# Patient Record
Sex: Female | Born: 1945 | Race: White | Hispanic: No | Marital: Married | Smoking: Never smoker
Health system: Southern US, Community
[De-identification: ages and names within clinical notes are randomized; demographics above are authoritative.]

---

## 1997-06-11 ENCOUNTER — Emergency Department (HOSPITAL_COMMUNITY): Admission: EM | Admit: 1997-06-11 | Discharge: 1997-06-11 | Payer: Self-pay | Admitting: Emergency Medicine

## 2005-03-02 ENCOUNTER — Emergency Department (HOSPITAL_COMMUNITY): Admission: EM | Admit: 2005-03-02 | Discharge: 2005-03-02 | Payer: Self-pay | Admitting: Emergency Medicine

## 2015-09-20 ENCOUNTER — Encounter (HOSPITAL_COMMUNITY): Payer: Self-pay | Admitting: *Deleted

## 2015-09-20 ENCOUNTER — Ambulatory Visit (HOSPITAL_COMMUNITY)
Admission: EM | Admit: 2015-09-20 | Discharge: 2015-09-20 | Disposition: A | Discharge status: Home / Self Care | Payer: 59 | Attending: Emergency Medicine | Admitting: Emergency Medicine

## 2015-09-20 DIAGNOSIS — T148 Other injury of unspecified body region: Secondary | ICD-10-CM | POA: Diagnosis not present

## 2015-09-20 DIAGNOSIS — W57XXXA Bitten or stung by nonvenomous insect and other nonvenomous arthropods, initial encounter: Secondary | ICD-10-CM | POA: Diagnosis not present

## 2015-09-20 NOTE — ED Notes (Signed)
Patient reports while at home yesterday she felt a pinch and reached back and felt that she had a small bump and it itched. Did not see a spider or any type of bug. Patient states this area has grown in size since yesterday and itches. Patient with small bug bite to middle of lower back. No signs of infection noted.

## 2015-09-20 NOTE — Discharge Instructions (Signed)
Insect Bite °Mosquitoes, flies, fleas, bedbugs, and other insects can bite. Insect bites are different from insect stings. The bite may be red, puffy (swollen), and itchy for 2 to 4 days. Most bites get better on their own. °HOME CARE  °· Do not scratch the bite. °· Keep the bite clean and dry. Wash the bite with soap and water every day, as told by your doctor. °· If directed, apply ice to the bite area. °¨ Put ice in a plastic bag. °¨ Place a towel between your skin and the bag. °¨ Leave the ice on for 20 minutes, 2-3 times per day. °· Follow instructions from your doctor about using medicated lotions or creams. These can help with itching. °· Apply or take over-the-counter and prescription medicines only as told by your doctor. °· If you were given an antibiotic medicine, use it as told by your doctor. Do not stop using the medicine even if your condition improves. °· Keep all follow-up visits as told by your doctor. This is important. °GET HELP IF: °· You have redness, swelling (inflammation), or pain near your bite that is getting worse. °· You have a fever. °GET HELP RIGHT AWAY IF:  °· You have joint pain.   °· You have fluid, blood, or pus coming from the bite area.   °· You have a headache. °· You have neck pain. °· You feel weaker than you normally do.   °· You have a rash.   °· You have chest pain. °· You have shortness of breath. °· You have stomach pain, feel sick to your stomach (nauseous), or throw up (vomit). °· You feel more tired or sleepy than you normally do. °  °This information is not intended to replace advice given to you by your health care provider. Make sure you discuss any questions you have with your health care provider. °  °Document Released: 02/18/2000 Document Revised: 11/11/2014 Document Reviewed: 07/08/2014 °Elsevier Interactive Patient Education ©2016 [employer] ° °

## 2015-09-20 NOTE — ED Provider Notes (Signed)
CSN: [id number]     Arrival date & time 09/20/15  1007 History   First MD Initiated Contact with Patient 09/20/15 1129     Chief Complaint  Patient presents with  . Insect Bite   (Consider location/radiation/quality/duration/timing/severity/associated sxs/prior Treatment) HPI History obtained from patient:  Pt presents with the cc of:  Bug bite Duration of symptoms: 4 days Treatment prior to arrival: Water alcohol and vinegar Context: Patient states that she thinks she was bitten by a possible brown recluse spider on the small of her back 4 days ago. She states that the red area was quite small discharged and now has enlarged a bit. She states that she did not see a spider but felt some type of bite or sting when she sat down on the couch. Other symptoms include: None Pain score: 1 FAMILY HISTORY: Cancer    History reviewed. No pertinent past medical history. History reviewed. No pertinent past surgical history. Family History  Problem Relation Age of Onset  . Cancer Mother   . Cancer Father    Social History  Substance Use Topics  . Smoking status: Never Smoker   . Smokeless tobacco: None  . Alcohol Use: No   OB History    No data available     Review of Systems  Denies: HEADACHE, NAUSEA, ABDOMINAL PAIN, CHEST PAIN, CONGESTION, DYSURIA, SHORTNESS OF BREATH  Allergies  Penicillins  Home Medications   Prior to Admission medications   Not on File   Meds Ordered and Administered this Visit  Medications - No data to display  BP 163/93 mmHg  Pulse 100  Temp(Src) 97.8 F (36.6 C) (Oral)  Resp 17  SpO2 100% No data found.   Physical Exam NURSES NOTES AND VITAL SIGNS REVIEWED. CONSTITUTIONAL: Well developed, well nourished, no acute distress HEENT: normocephalic, atraumatic EYES: Conjunctiva normal NECK:normal ROM, supple, no adenopathy PULMONARY:No respiratory distress, normal effort ABDOMINAL: Soft, ND, NT BS+, No CVAT MUSCULOSKELETAL: Normal ROM of all  extremities, Patient has a small red minimally swollen area at the small of her back nontender not hot to touch. No signs of cellulitis. SKIN: warm and dry without rash PSYCHIATRIC: Mood and affect, behavior are normal  ED Course  Procedures (including critical care time)  Labs Review Labs Reviewed - No data to display  Imaging Review No results found.   Visual Acuity Review  Right Eye Distance:   Left Eye Distance:   Bilateral Distance:    Right Eye Near:   Left Eye Near:    Bilateral Near:         MDM   1. Insect bite     Patient is reassured that there are no issues that require transfer to higher level of care at this time or additional tests. Patient is advised to continue home symptomatic treatment. Patient is advised that if there are new or worsening symptoms to attend the emergency department, contact primary care provider, or return to UC. Instructions of care provided discharged home in stable condition.    THIS NOTE WAS GENERATED USING A VOICE RECOGNITION SOFTWARE PROGRAM. ALL REASONABLE EFFORTS  WERE MADE TO PROOFREAD THIS DOCUMENT FOR ACCURACY.  I have verbally reviewed the discharge instructions with the patient. A printed AVS was given to the patient.  All questions were answered prior to discharge.      Tharon AquasFrank C Patrick, PA 09/20/15 1904

## 2016-07-07 ENCOUNTER — Encounter: Payer: Self-pay | Admitting: Family Medicine

## 2016-07-07 ENCOUNTER — Ambulatory Visit (INDEPENDENT_AMBULATORY_CARE_PROVIDER_SITE_OTHER): Payer: 59 | Admitting: Family Medicine

## 2016-07-07 VITALS — BP 140/80 | HR 96 | Temp 98.2°F | Resp 14 | Ht 63.5 in | Wt 122.0 lb

## 2016-07-07 DIAGNOSIS — R5382 Chronic fatigue, unspecified: Secondary | ICD-10-CM

## 2016-07-07 DIAGNOSIS — Z0189 Encounter for other specified special examinations: Secondary | ICD-10-CM | POA: Diagnosis not present

## 2016-07-07 DIAGNOSIS — Z0000 Encounter for general adult medical examination without abnormal findings: Secondary | ICD-10-CM

## 2016-07-07 DIAGNOSIS — Z23 Encounter for immunization: Secondary | ICD-10-CM

## 2016-07-07 LAB — CBC WITH DIFFERENTIAL/PLATELET
Basophils Absolute: 0 cells/uL (ref 0–200)
Basophils Relative: 0 %
EOS PCT: 1 %
Eosinophils Absolute: 46 cells/uL (ref 15–500)
HCT: 41.7 % (ref 35.0–45.0)
HEMOGLOBIN: 13.9 g/dL (ref 12.0–15.0)
LYMPHS ABS: 2070 {cells}/uL (ref 850–3900)
Lymphocytes Relative: 45 %
MCH: 28.4 pg (ref 27.0–33.0)
MCHC: 33.3 g/dL (ref 32.0–36.0)
MCV: 85.3 fL (ref 80.0–100.0)
MONOS PCT: 9 %
MPV: 9.9 fL (ref 7.5–12.5)
Monocytes Absolute: 414 cells/uL (ref 200–950)
NEUTROS ABS: 2070 {cells}/uL (ref 1500–7800)
NEUTROS PCT: 45 %
PLATELETS: 233 10*3/uL (ref 140–400)
RBC: 4.89 MIL/uL (ref 3.80–5.10)
RDW: 14.2 % (ref 11.0–15.0)
WBC: 4.6 10*3/uL (ref 3.8–10.8)

## 2016-07-07 NOTE — Progress Notes (Signed)
Subjective:    Patient ID: Christina Jeffersonatricia Chase, female    DOB: 10-09-1945, 71 y.o.   MRN: [id number]  HPI Here today to establish care. Patient is due for Pneumovax 23, Prevnar 13, the shingles vaccine, and the tetanus shot. She is also overdue for mammogram, colonoscopy, Pap smear, hepatitis C screening, and a bone density test. Her only concern today is to update her immunizations. No past medical history on file. No past surgical history on file. No current outpatient prescriptions on file prior to visit.   No current facility-administered medications on file prior to visit.     Allergies  Allergen Reactions  . Penicillins    Social History   Social History  . Marital status: Married    Spouse name: N/A  . Number of children: N/A  . Years of education: N/A   Occupational History  . Not on file.   Social History Main Topics  . Smoking status: Never Smoker  . Smokeless tobacco: Never Used  . Alcohol use No  . Drug use: No  . Sexual activity: Not on file   Other Topics Concern  . Not on file   Social History Narrative  . No narrative on file   Family History  Problem Relation Age of Onset  . Dementia Mother   . Heart disease Mother   . Heart disease Father   . Hypertension Father   . Cancer Maternal Grandmother     colon       Review of Systems  Constitutional: Positive for fatigue.  All other systems reviewed and are negative.      Objective:   Physical Exam  Constitutional: She is oriented to person, place, and time. She appears well-developed and well-nourished. No distress.  HENT:  Head: Normocephalic and atraumatic.  Right Ear: External ear normal.  Left Ear: External ear normal.  Nose: Nose normal.  Mouth/Throat: Oropharynx is clear and moist. No oropharyngeal exudate.  Eyes: Conjunctivae and EOM are normal. Pupils are equal, round, and reactive to light. Right eye exhibits no discharge. Left eye exhibits no discharge. No scleral icterus.    Neck: Normal range of motion. Neck supple. No JVD present. No tracheal deviation present. No thyromegaly present.  Cardiovascular: Normal rate, regular rhythm, normal heart sounds and intact distal pulses.  Exam reveals no gallop and no friction rub.   No murmur heard. Pulmonary/Chest: Effort normal and breath sounds normal. No stridor. No respiratory distress. She has no wheezes. She has no rales. She exhibits no tenderness.  Abdominal: Soft. Bowel sounds are normal. She exhibits no distension and no mass. There is no tenderness. There is no rebound and no guarding.  Musculoskeletal: Normal range of motion. She exhibits no edema, tenderness or deformity.  Lymphadenopathy:    She has no cervical adenopathy.  Neurological: She is alert and oriented to person, place, and time. She has normal reflexes. She displays normal reflexes. No cranial nerve deficit. She exhibits normal muscle tone. Coordination normal.  Skin: No rash noted. She is not diaphoretic. No erythema. No pallor.  Psychiatric: She has a normal mood and affect. Her behavior is normal. Judgment and thought content normal.  Vitals reviewed.         Assessment & Plan:  General medical exam - Plan: Lipid panel  Chronic fatigue - Plan: CBC with Differential/Platelet, COMPLETE METABOLIC PANEL WITH GFR, TSH, Vitamin B12 Patient is on no medication and denies any medical history. She denies any previous surgeries. Overall she's been extremely healthy.  She agrees to receive Pneumovax 23 area she will consider Prevnar 13 next year we discussed the shingles shot but she declined that today. She declined the tetanus shot. She states that she will schedule to see a gynecologist for her pelvic exam, mammogram, and bone density. She refuses to allow me to schedule a colonoscopy for her. She does consent to basic lab work including a CBC, CMP, fasting lipid panel. She also reports some fatigue and therefore she would like me to check her thyroid  as well as her B12.

## 2016-07-07 NOTE — Addendum Note (Signed)
Addended by: Legrand RamsWILLIS, Erie Sica B on: 07/07/2016 11:29 AM   Modules accepted: Orders

## 2016-07-08 LAB — COMPLETE METABOLIC PANEL WITH GFR
ALBUMIN: 4.8 g/dL (ref 3.6–5.1)
ALK PHOS: 63 U/L (ref 33–130)
ALT: 15 U/L (ref 6–29)
AST: 20 U/L (ref 10–35)
BUN: 11 mg/dL (ref 7–25)
CHLORIDE: 102 mmol/L (ref 98–110)
CO2: 26 mmol/L (ref 20–31)
Calcium: 10.1 mg/dL (ref 8.6–10.4)
Creat: 0.61 mg/dL (ref 0.60–0.93)
GFR, Est African American: >89 mL/min (ref >=60)
GLUCOSE: 113 mg/dL — AB (ref 70–99)
POTASSIUM: 3.8 mmol/L (ref 3.5–5.3)
SODIUM: 140 mmol/L (ref 135–146)
Total Bilirubin: 0.8 mg/dL (ref 0.2–1.2)
Total Protein: 7 g/dL (ref 6.1–8.1)

## 2016-07-08 LAB — VITAMIN B12: Vitamin B-12: 697 pg/mL (ref 200–1100)

## 2016-07-08 LAB — LIPID PANEL
CHOL/HDL RATIO: 2.8 ratio (ref <5.0)
Cholesterol: 218 mg/dL — ABNORMAL HIGH (ref <200)
HDL: 78 mg/dL (ref >50)
LDL CALC: 127 mg/dL — AB (ref <100)
Triglycerides: 67 mg/dL (ref <150)
VLDL: 13 mg/dL (ref <30)

## 2016-07-08 LAB — TSH: TSH: 1.41 m[IU]/L

## 2016-07-10 ENCOUNTER — Encounter: Payer: Self-pay | Admitting: Family Medicine

## 2017-08-30 DIAGNOSIS — K046 Periapical abscess with sinus: Secondary | ICD-10-CM | POA: Diagnosis not present

## 2017-10-05 ENCOUNTER — Encounter (HOSPITAL_COMMUNITY)
Admission: EM | Disposition: A | Discharge status: Home / Self Care | Payer: Self-pay | Source: Home / Self Care | Attending: Family Medicine

## 2017-10-05 ENCOUNTER — Other Ambulatory Visit: Payer: Self-pay

## 2017-10-05 ENCOUNTER — Inpatient Hospital Stay (HOSPITAL_COMMUNITY): Payer: Medicare Other

## 2017-10-05 ENCOUNTER — Emergency Department (HOSPITAL_COMMUNITY): Payer: Medicare Other

## 2017-10-05 ENCOUNTER — Inpatient Hospital Stay (HOSPITAL_COMMUNITY): Payer: Medicare Other | Admitting: Anesthesiology

## 2017-10-05 ENCOUNTER — Encounter (HOSPITAL_COMMUNITY): Payer: Self-pay | Admitting: *Deleted

## 2017-10-05 ENCOUNTER — Inpatient Hospital Stay (HOSPITAL_COMMUNITY)
Admission: EM | Admit: 2017-10-05 | Discharge: 2017-10-09 | DRG: 470 | Disposition: A | Discharge status: Home / Self Care | Payer: Medicare Other | Attending: Family Medicine | Admitting: Family Medicine

## 2017-10-05 DIAGNOSIS — Z880 Allergy status to penicillin: Secondary | ICD-10-CM | POA: Diagnosis not present

## 2017-10-05 DIAGNOSIS — E861 Hypovolemia: Secondary | ICD-10-CM | POA: Diagnosis present

## 2017-10-05 DIAGNOSIS — R7302 Impaired glucose tolerance (oral): Secondary | ICD-10-CM | POA: Diagnosis present

## 2017-10-05 DIAGNOSIS — Z8249 Family history of ischemic heart disease and other diseases of the circulatory system: Secondary | ICD-10-CM

## 2017-10-05 DIAGNOSIS — Z471 Aftercare following joint replacement surgery: Secondary | ICD-10-CM | POA: Diagnosis not present

## 2017-10-05 DIAGNOSIS — F419 Anxiety disorder, unspecified: Secondary | ICD-10-CM | POA: Diagnosis present

## 2017-10-05 DIAGNOSIS — Z96642 Presence of left artificial hip joint: Secondary | ICD-10-CM | POA: Diagnosis not present

## 2017-10-05 DIAGNOSIS — E785 Hyperlipidemia, unspecified: Secondary | ICD-10-CM | POA: Diagnosis present

## 2017-10-05 DIAGNOSIS — W1809XA Striking against other object with subsequent fall, initial encounter: Secondary | ICD-10-CM | POA: Diagnosis present

## 2017-10-05 DIAGNOSIS — Z01818 Encounter for other preprocedural examination: Secondary | ICD-10-CM | POA: Diagnosis not present

## 2017-10-05 DIAGNOSIS — Y92009 Unspecified place in unspecified non-institutional (private) residence as the place of occurrence of the external cause: Secondary | ICD-10-CM | POA: Diagnosis not present

## 2017-10-05 DIAGNOSIS — S72002A Fracture of unspecified part of neck of left femur, initial encounter for closed fracture: Secondary | ICD-10-CM | POA: Diagnosis not present

## 2017-10-05 DIAGNOSIS — Z800 Family history of malignant neoplasm of digestive organs: Secondary | ICD-10-CM

## 2017-10-05 DIAGNOSIS — Z79899 Other long term (current) drug therapy: Secondary | ICD-10-CM

## 2017-10-05 DIAGNOSIS — Z96641 Presence of right artificial hip joint: Secondary | ICD-10-CM | POA: Diagnosis not present

## 2017-10-05 DIAGNOSIS — D696 Thrombocytopenia, unspecified: Secondary | ICD-10-CM | POA: Diagnosis present

## 2017-10-05 DIAGNOSIS — Z419 Encounter for procedure for purposes other than remedying health state, unspecified: Secondary | ICD-10-CM

## 2017-10-05 DIAGNOSIS — E871 Hypo-osmolality and hyponatremia: Secondary | ICD-10-CM | POA: Diagnosis present

## 2017-10-05 DIAGNOSIS — S72012A Unspecified intracapsular fracture of left femur, initial encounter for closed fracture: Principal | ICD-10-CM | POA: Diagnosis present

## 2017-10-05 DIAGNOSIS — S72092A Other fracture of head and neck of left femur, initial encounter for closed fracture: Secondary | ICD-10-CM | POA: Diagnosis not present

## 2017-10-05 DIAGNOSIS — Z881 Allergy status to other antibiotic agents status: Secondary | ICD-10-CM

## 2017-10-05 DIAGNOSIS — R17 Unspecified jaundice: Secondary | ICD-10-CM | POA: Diagnosis present

## 2017-10-05 DIAGNOSIS — D62 Acute posthemorrhagic anemia: Secondary | ICD-10-CM | POA: Diagnosis not present

## 2017-10-05 DIAGNOSIS — M25752 Osteophyte, left hip: Secondary | ICD-10-CM | POA: Diagnosis present

## 2017-10-05 HISTORY — PX: TOTAL HIP ARTHROPLASTY: SHX124

## 2017-10-05 LAB — CBC WITH DIFFERENTIAL/PLATELET
Abs Immature Granulocytes: 0.1 10*3/uL (ref 0.0–0.1)
BASOS ABS: 0 10*3/uL (ref 0.0–0.1)
Basophils Relative: 0 %
Eosinophils Absolute: 0 10*3/uL (ref 0.0–0.7)
Eosinophils Relative: 0 %
HCT: 43.3 % (ref 36.0–46.0)
HEMOGLOBIN: 14.4 g/dL (ref 12.0–15.0)
IMMATURE GRANULOCYTES: 1 %
LYMPHS PCT: 9 %
Lymphs Abs: 0.9 10*3/uL (ref 0.7–4.0)
MCH: 29 pg (ref 26.0–34.0)
MCHC: 33.3 g/dL (ref 30.0–36.0)
MCV: 87.1 fL (ref 78.0–100.0)
Monocytes Absolute: 0.5 10*3/uL (ref 0.1–1.0)
Monocytes Relative: 5 %
NEUTROS ABS: 8.5 10*3/uL — AB (ref 1.7–7.7)
NEUTROS PCT: 85 %
Platelets: 182 10*3/uL (ref 150–400)
RBC: 4.97 MIL/uL (ref 3.87–5.11)
RDW: 13.7 % (ref 11.5–15.5)
WBC: 10 10*3/uL (ref 4.0–10.5)

## 2017-10-05 LAB — PROTIME-INR
INR: 0.98
PROTHROMBIN TIME: 12.9 s (ref 11.4–15.2)

## 2017-10-05 LAB — BASIC METABOLIC PANEL
ANION GAP: 12 (ref 5–15)
BUN: 6 mg/dL — ABNORMAL LOW (ref 8–23)
CHLORIDE: 97 mmol/L — AB (ref 98–111)
CO2: 24 mmol/L (ref 22–32)
Calcium: 10.8 mg/dL — ABNORMAL HIGH (ref 8.9–10.3)
Creatinine, Ser: 0.58 mg/dL (ref 0.44–1.00)
GFR calc non Af Amer: >60 mL/min (ref >60)
Glucose, Bld: 124 mg/dL — ABNORMAL HIGH (ref 70–99)
POTASSIUM: 3.5 mmol/L (ref 3.5–5.1)
SODIUM: 133 mmol/L — AB (ref 135–145)

## 2017-10-05 LAB — APTT: APTT: 28 s (ref 24–36)

## 2017-10-05 LAB — ABO/RH: ABO/RH(D): O POS

## 2017-10-05 SURGERY — ARTHROPLASTY, HIP, TOTAL, ANTERIOR APPROACH
Anesthesia: General | Site: Hip | Laterality: Left

## 2017-10-05 MED ORDER — ONDANSETRON HCL 4 MG/2ML IJ SOLN
INTRAMUSCULAR | Status: AC
  Filled 2017-10-05: qty 4

## 2017-10-05 MED ORDER — SODIUM CHLORIDE 0.9 % IV SOLN
INTRAVENOUS | Status: DC | PRN
  Administered 2017-10-05: 50 ug/min via INTRAVENOUS

## 2017-10-05 MED ORDER — PHENYLEPHRINE HCL 10 MG/ML IJ SOLN
INTRAMUSCULAR | Status: DC | PRN
  Administered 2017-10-05 (×3): 80 ug via INTRAVENOUS
  Administered 2017-10-05: 120 ug via INTRAVENOUS
  Administered 2017-10-05 (×3): 80 ug via INTRAVENOUS

## 2017-10-05 MED ORDER — MORPHINE SULFATE (PF) 4 MG/ML IV SOLN
0.5000 mg | INTRAVENOUS | Status: DC | PRN
  Administered 2017-10-06: 0.52 mg via INTRAVENOUS
  Filled 2017-10-05: qty 1

## 2017-10-05 MED ORDER — POVIDONE-IODINE 10 % EX SWAB: 2.0000 "application " | Freq: Once | CUTANEOUS | Status: DC

## 2017-10-05 MED ORDER — HYDROCODONE-ACETAMINOPHEN 5-325 MG PO TABS: 1.0000 | ORAL_TABLET | Freq: Four times a day (QID) | ORAL | Status: DC | PRN

## 2017-10-05 MED ORDER — PROPOFOL 10 MG/ML IV BOLUS
INTRAVENOUS | Status: DC | PRN
  Administered 2017-10-05 (×2): 30 mg via INTRAVENOUS
  Administered 2017-10-05: 40 mg via INTRAVENOUS

## 2017-10-05 MED ORDER — MIDAZOLAM HCL 2 MG/2ML IJ SOLN
INTRAMUSCULAR | Status: DC | PRN
  Administered 2017-10-05: 2 mg via INTRAVENOUS

## 2017-10-05 MED ORDER — PROPOFOL 500 MG/50ML IV EMUL
INTRAVENOUS | Status: DC | PRN
  Administered 2017-10-05: 50 ug/kg/min via INTRAVENOUS

## 2017-10-05 MED ORDER — BUPIVACAINE-EPINEPHRINE (PF) 0.25% -1:200000 IJ SOLN
INTRAMUSCULAR | Status: AC
  Filled 2017-10-05: qty 30

## 2017-10-05 MED ORDER — ACETAMINOPHEN 10 MG/ML IV SOLN
INTRAVENOUS | Status: AC
  Filled 2017-10-05: qty 100

## 2017-10-05 MED ORDER — NALOXONE HCL 0.4 MG/ML IJ SOLN
INTRAMUSCULAR | Status: AC
  Filled 2017-10-05: qty 1

## 2017-10-05 MED ORDER — LIDOCAINE 2% (20 MG/ML) 5 ML SYRINGE
INTRAMUSCULAR | Status: AC
  Filled 2017-10-05: qty 20

## 2017-10-05 MED ORDER — CEFAZOLIN SODIUM-DEXTROSE 2-4 GM/100ML-% IV SOLN: 2.0000 g | INTRAVENOUS | Status: DC

## 2017-10-05 MED ORDER — HYDROMORPHONE HCL 1 MG/ML IJ SOLN
INTRAMUSCULAR | Status: AC
  Filled 2017-10-05: qty 1

## 2017-10-05 MED ORDER — MIDAZOLAM HCL 2 MG/2ML IJ SOLN
INTRAMUSCULAR | Status: AC
  Filled 2017-10-05: qty 2

## 2017-10-05 MED ORDER — FENTANYL CITRATE (PF) 100 MCG/2ML IJ SOLN
50.0000 ug | INTRAMUSCULAR | Status: DC | PRN
  Filled 2017-10-05: qty 2

## 2017-10-05 MED ORDER — OXYCODONE HCL 5 MG/5ML PO SOLN: 5.0000 mg | Freq: Once | ORAL | Status: DC | PRN

## 2017-10-05 MED ORDER — PROBIOTIC ACIDOPHILUS PO CAPS: 1.0000 | ORAL_CAPSULE | Freq: Every day | ORAL | Status: DC

## 2017-10-05 MED ORDER — DEXAMETHASONE SODIUM PHOSPHATE 10 MG/ML IJ SOLN
INTRAMUSCULAR | Status: AC
  Filled 2017-10-05: qty 1

## 2017-10-05 MED ORDER — 0.9 % SODIUM CHLORIDE (POUR BTL) OPTIME
TOPICAL | Status: DC | PRN
  Administered 2017-10-05: 1000 mL

## 2017-10-05 MED ORDER — KETOROLAC TROMETHAMINE 30 MG/ML IJ SOLN
INTRAMUSCULAR | Status: AC
  Filled 2017-10-05: qty 1

## 2017-10-05 MED ORDER — SUGAMMADEX SODIUM 500 MG/5ML IV SOLN
INTRAVENOUS | Status: AC
  Filled 2017-10-05: qty 5

## 2017-10-05 MED ORDER — ONDANSETRON HCL 4 MG/2ML IJ SOLN
INTRAMUSCULAR | Status: DC | PRN
  Administered 2017-10-05: 4 mg via INTRAVENOUS

## 2017-10-05 MED ORDER — TRANEXAMIC ACID 1000 MG/10ML IV SOLN
1000.0000 mg | INTRAVENOUS | Status: AC
  Administered 2017-10-05: 1000 mg via INTRAVENOUS
  Filled 2017-10-05: qty 10

## 2017-10-05 MED ORDER — BUPIVACAINE-EPINEPHRINE (PF) 0.25% -1:200000 IJ SOLN
INTRAMUSCULAR | Status: DC | PRN
  Administered 2017-10-05: 30 mL

## 2017-10-05 MED ORDER — SODIUM CHLORIDE 0.9 % IV BOLUS
500.0000 mL | Freq: Once | INTRAVENOUS | Status: AC
  Administered 2017-10-05: 500 mL via INTRAVENOUS

## 2017-10-05 MED ORDER — VANCOMYCIN HCL IN DEXTROSE 1-5 GM/200ML-% IV SOLN
1000.0000 mg | INTRAVENOUS | Status: AC
  Administered 2017-10-05: 1000 mg via INTRAVENOUS
  Filled 2017-10-05: qty 200

## 2017-10-05 MED ORDER — HYDROMORPHONE HCL 1 MG/ML IJ SOLN
0.2500 mg | INTRAMUSCULAR | Status: DC | PRN
  Administered 2017-10-05: 0.25 mg via INTRAVENOUS

## 2017-10-05 MED ORDER — HEPARIN SODIUM (PORCINE) 5000 UNIT/ML IJ SOLN
5000.0000 [IU] | Freq: Three times a day (TID) | INTRAMUSCULAR | Status: DC
Start: 2017-10-05 — End: 2017-10-06
  Administered 2017-10-05 – 2017-10-06 (×2): 5000 [IU] via SUBCUTANEOUS
  Filled 2017-10-05 (×2): qty 1

## 2017-10-05 MED ORDER — CHLORHEXIDINE GLUCONATE 4 % EX LIQD: 60.0000 mL | Freq: Once | CUTANEOUS | Status: DC

## 2017-10-05 MED ORDER — EPHEDRINE 5 MG/ML INJ
INTRAVENOUS | Status: AC
  Filled 2017-10-05: qty 10

## 2017-10-05 MED ORDER — ONDANSETRON HCL 4 MG/2ML IJ SOLN
INTRAMUSCULAR | Status: AC
  Filled 2017-10-05: qty 2

## 2017-10-05 MED ORDER — BUPIVACAINE IN DEXTROSE 0.75-8.25 % IT SOLN
INTRATHECAL | Status: DC | PRN
  Administered 2017-10-05: 1.6 mL via INTRATHECAL

## 2017-10-05 MED ORDER — ROCURONIUM BROMIDE 10 MG/ML (PF) SYRINGE
PREFILLED_SYRINGE | INTRAVENOUS | Status: AC
  Filled 2017-10-05: qty 10

## 2017-10-05 MED ORDER — PROPOFOL 10 MG/ML IV BOLUS
INTRAVENOUS | Status: AC
  Filled 2017-10-05: qty 20

## 2017-10-05 MED ORDER — PHENYLEPHRINE 40 MCG/ML (10ML) SYRINGE FOR IV PUSH (FOR BLOOD PRESSURE SUPPORT)
PREFILLED_SYRINGE | INTRAVENOUS | Status: AC
  Filled 2017-10-05: qty 10

## 2017-10-05 MED ORDER — ACETAMINOPHEN 10 MG/ML IV SOLN
1000.0000 mg | INTRAVENOUS | Status: AC
  Administered 2017-10-05: 1000 mg via INTRAVENOUS

## 2017-10-05 MED ORDER — SODIUM CHLORIDE 0.9 % IR SOLN
Status: DC | PRN
  Administered 2017-10-05: 3000 mL

## 2017-10-05 MED ORDER — PROMETHAZINE HCL 25 MG/ML IJ SOLN: 6.2500 mg | INTRAMUSCULAR | Status: DC | PRN

## 2017-10-05 MED ORDER — LACTATED RINGERS IV SOLN
INTRAVENOUS | Status: DC
  Administered 2017-10-05 – 2017-10-09 (×3): via INTRAVENOUS

## 2017-10-05 MED ORDER — KETOROLAC TROMETHAMINE 30 MG/ML IJ SOLN
INTRAMUSCULAR | Status: DC | PRN
  Administered 2017-10-05: 30 mg via INTRAMUSCULAR

## 2017-10-05 MED ORDER — OXYCODONE HCL 5 MG PO TABS: 5.0000 mg | ORAL_TABLET | Freq: Once | ORAL | Status: DC | PRN

## 2017-10-05 SURGICAL SUPPLY — 59 items
ACETAB CUP W GRIPTION 54MM (Plate) ×1 IMPLANT
ACETAB CUP W/GRIPTION 54 (Plate) ×2 IMPLANT
ADH SKN CLS APL DERMABOND .7 (GAUZE/BANDAGES/DRESSINGS) ×2
ALCOHOL ISOPROPYL (RUBBING) (MISCELLANEOUS) ×3 IMPLANT
BLADE CLIPPER SURG (BLADE) IMPLANT
CHLORAPREP W/TINT 26ML (MISCELLANEOUS) ×3 IMPLANT
COVER SURGICAL LIGHT HANDLE (MISCELLANEOUS) ×3 IMPLANT
CUP ACETAB W/GRIPTION 54 (Plate) ×1 IMPLANT
DERMABOND ADVANCED (GAUZE/BANDAGES/DRESSINGS) ×4
DERMABOND ADVANCED .7 DNX12 (GAUZE/BANDAGES/DRESSINGS) ×2 IMPLANT
DRAPE C-ARM 42X72 X-RAY (DRAPES) ×3 IMPLANT
DRAPE STERI IOBAN 125X83 (DRAPES) ×3 IMPLANT
DRAPE U-SHAPE 47X51 STRL (DRAPES) ×9 IMPLANT
DRSG AQUACEL AG ADV 3.5X10 (GAUZE/BANDAGES/DRESSINGS) ×3 IMPLANT
ELECT BLADE 4.0 EZ CLEAN MEGAD (MISCELLANEOUS) ×3
ELECT PENCIL ROCKER SW 15FT (MISCELLANEOUS) ×3 IMPLANT
ELECT REM PT RETURN 9FT ADLT (ELECTROSURGICAL) ×3
ELECTRODE BLDE 4.0 EZ CLN MEGD (MISCELLANEOUS) ×1 IMPLANT
ELECTRODE REM PT RTRN 9FT ADLT (ELECTROSURGICAL) ×1 IMPLANT
EVACUATOR 1/8 PVC DRAIN (DRAIN) IMPLANT
GLOVE BIO SURGEON STRL SZ8.5 (GLOVE) ×6 IMPLANT
GLOVE BIOGEL PI IND STRL 8.5 (GLOVE) ×1 IMPLANT
GLOVE BIOGEL PI INDICATOR 8.5 (GLOVE) ×2
GOWN STRL REUS W/ TWL LRG LVL3 (GOWN DISPOSABLE) ×2 IMPLANT
GOWN STRL REUS W/TWL 2XL LVL3 (GOWN DISPOSABLE) ×3 IMPLANT
GOWN STRL REUS W/TWL LRG LVL3 (GOWN DISPOSABLE) ×6
HANDPIECE INTERPULSE COAX TIP (DISPOSABLE) ×3
HEAD CERAMIC DELTA 36 PLUS 1.5 (Hips) ×3 IMPLANT
HOOD PEEL AWAY FACE SHEILD DIS (HOOD) ×6 IMPLANT
KIT BASIN OR (CUSTOM PROCEDURE TRAY) ×3 IMPLANT
KIT TURNOVER KIT B (KITS) ×3 IMPLANT
LINER NEUTRAL 54X36MM PLUS 4 (Hips) ×3 IMPLANT
MANIFOLD NEPTUNE II (INSTRUMENTS) ×3 IMPLANT
MARKER SKIN DUAL TIP RULER LAB (MISCELLANEOUS) ×6 IMPLANT
NEEDLE SPNL 18GX3.5 QUINCKE PK (NEEDLE) ×3 IMPLANT
NS IRRIG 1000ML POUR BTL (IV SOLUTION) ×3 IMPLANT
PACK TOTAL JOINT (CUSTOM PROCEDURE TRAY) ×3 IMPLANT
PACK UNIVERSAL I (CUSTOM PROCEDURE TRAY) ×3 IMPLANT
PAD ARMBOARD 7.5X6 YLW CONV (MISCELLANEOUS) ×6 IMPLANT
SAW OSC TIP CART 19.5X105X1.3 (SAW) ×3 IMPLANT
SEALER BIPOLAR AQUA 6.0 (INSTRUMENTS) IMPLANT
SET HNDPC FAN SPRY TIP SCT (DISPOSABLE) ×1 IMPLANT
SOL PREP POV-IOD 4OZ 10% (MISCELLANEOUS) ×3 IMPLANT
STEM TRI LOC BPS SZ7 W GRIPTON (Hips) ×1 IMPLANT
SUT ETHIBOND NAB CT1 #1 30IN (SUTURE) ×6 IMPLANT
SUT MNCRL AB 3-0 PS2 18 (SUTURE) ×3 IMPLANT
SUT MON AB 2-0 CT1 36 (SUTURE) ×3 IMPLANT
SUT VIC AB 1 CT1 27 (SUTURE) ×3
SUT VIC AB 1 CT1 27XBRD ANBCTR (SUTURE) ×1 IMPLANT
SUT VIC AB 2-0 CT1 27 (SUTURE) ×3
SUT VIC AB 2-0 CT1 TAPERPNT 27 (SUTURE) ×1 IMPLANT
SUT VLOC 180 0 24IN GS25 (SUTURE) ×3 IMPLANT
SYR 50ML LL SCALE MARK (SYRINGE) ×3 IMPLANT
TOWEL OR 17X24 6PK STRL BLUE (TOWEL DISPOSABLE) ×3 IMPLANT
TOWEL OR 17X26 10 PK STRL BLUE (TOWEL DISPOSABLE) ×3 IMPLANT
TRAY CATH 16FR W/PLASTIC CATH (SET/KITS/TRAYS/PACK) IMPLANT
TRAY FOLEY CATH SILVER 16FR (SET/KITS/TRAYS/PACK) IMPLANT
TRI LOC BPS SZ 7 W GRIPTON (Hips) ×3 IMPLANT
WATER STERILE IRR 1000ML POUR (IV SOLUTION) ×9 IMPLANT

## 2017-10-05 NOTE — Transfer of Care (Signed)
Immediate Anesthesia Transfer of Care Note  Patient: Christina Chase  Procedure(s) Performed: TOTAL HIP ARTHROPLASTY ANTERIOR APPROACH (Left Hip)  Patient Location: PACU  Anesthesia Type:MAC and Spinal  Level of Consciousness: awake, alert  and patient cooperative  Airway & Oxygen Therapy: Patient Spontanous Breathing  Post-op Assessment: Report given to RN and Post -op Vital signs reviewed and stable  Post vital signs: Reviewed and stable  Last Vitals:  Vitals Value Taken Time  BP 95/58 10/05/2017  6:06 PM  Temp    Pulse 71 10/05/2017  6:07 PM  Resp 16 10/05/2017  6:07 PM  SpO2 100 % 10/05/2017  6:07 PM  Vitals shown include unvalidated device data.  Last Pain:  Vitals:   10/05/17 1237  TempSrc:   PainSc: 10-Worst pain ever         Complications: No apparent anesthesia complications

## 2017-10-05 NOTE — ED Notes (Signed)
Pt declining any pain medicine.

## 2017-10-05 NOTE — Anesthesia Procedure Notes (Signed)
Spinal  Patient location during procedure: OR Start time: 10/05/2017 4:16 PM End time: 10/05/2017 4:26 PM Staffing Anesthesiologist: Heather RobertsSinger, Marigny Borre, MD Performed: anesthesiologist  Preanesthetic Checklist Completed: patient identified, surgical consent, pre-op evaluation, timeout performed, IV checked, risks and benefits discussed and monitors and equipment checked Spinal Block Patient position: sitting Prep: DuraPrep Patient monitoring: cardiac monitor, continuous pulse ox and blood pressure Approach: midline Location: L2-3 Injection technique: single-shot Needle Needle type: Pencan  Needle gauge: 24 G Needle length: 9 cm Additional Notes Functioning IV was confirmed and monitors were applied. Sterile prep and drape, including hand hygiene and sterile gloves were used. The patient was positioned and the spine was prepped. The skin was anesthetized with lidocaine.  Free flow of clear CSF was obtained prior to injecting local anesthetic into the CSF.  The spinal needle aspirated freely following injection.  The needle was carefully withdrawn.  The patient tolerated the procedure well.

## 2017-10-05 NOTE — Op Note (Signed)
OPERATIVE REPORT  SURGEON: Samson FredericBrian Elisabetta Mishra, MD   ASSISTANT: Hart CarwinJustin Queen, RNFA.  PREOPERATIVE DIAGNOSIS: Displaced Left  femoral neck fracture.   POSTOPERATIVE DIAGNOSIS: Displaced Left  femoral neck fracture.    PROCEDURE: Left total hip arthroplasty, anterior approach.   IMPLANTS: DePuy Tri Lock stem, size 7, hi offset. DePuy Pinnacle Cup, size 54 mm. DePuy Altrx liner, size 36 by 54 mm, +4 neutral. DePuy Biolox ceramic head ball, size 36 + 1.5 mm.  ANESTHESIA:  Spinal  ESTIMATED BLOOD LOSS:-250 mL    ANTIBIOTICS: 1 g vancomycin.  DRAINS: None.  COMPLICATIONS: None.   CONDITION: PACU - hemodynamically stable.   BRIEF CLINICAL NOTE: Christina Chase is a 72 y.o. female with a displaced left femoral neck fracture after a ground level fall. She came to the emergency department with hip pain and inability to weight bear. Imaging showed a displaced femoral neck fracture. Due to her age and activity level, the patient was indicated for total hip arthroplasty. The risks, benefits, and alternatives to the procedure were explained, and the patient elected to proceed.  PROCEDURE IN DETAIL: Surgical site was marked by myself in the pre-op holding area. Once inside the operating room, spinal anesthesia was obtained, and a foley catheter was inserted. The patient was then positioned on the Hana table. All bony prominences were well padded. The hip was prepped and draped in the normal sterile surgical fashion. A time-out was called verifying side and site of surgery. The patient received IV antibiotics within 60 minutes of beginning the procedure.  The direct anterior approach to the hip was performed through the Hueter interval. Lateral femoral circumflex vessels were treated with the Auqumantys. The anterior capsule was exposed and an inverted T capsulotomy was made. The fracture hematoma was encountered and evacuated.  There is a comminuted subcapital femoral neck fracture.   The femoral neck cut was made to the level of the templated cut. The femoral head was found to have eburnated bone. The head was passed to the back table and was measured.  Acetabular exposure was achieved, and the pulvinar and labrum were excised. Sequential reaming of the acetabulum was then performed up to a size 53 mm reamer. A 54 mm cup was then opened and impacted into place at approximately 40 degrees of abduction and 20 degrees of anteversion. The final polyethylene liner was impacted into place and acetabular osteophytes were removed.   I then gained femoral exposure taking care to protect the abductors and greater trochanter. This was performed using standard external rotation, extension, and adduction. The capsule was peeled off the inner aspect of the greater trochanter, taking care to preserve the short external rotators. A cookie cutter was used to enter the femoral canal, and then the femoral canal finder was placed. Sequential broaching was performed up to a size 7. Calcar planer was used on the femoral neck remnant. I placed a hi offset neck and a trial head ball. The hip was reduced. Leg lengths and offset were checked fluoroscopically. The hip was dislocated and trial components were removed. The final implants were placed, and the hip was reduced.  Fluoroscopy was used to confirm component position and leg lengths. At 90 degrees of external rotation and full extension, the hip was stable to an anterior directed force.  The wound was copiously irrigated with normal saline using pulse lavage. Marcaine solution was injected into the periarticular soft tissue. The wound was closed in layers using #1 Vicryl and V-Loc for the fascia, 2-0 Vicryl  for the subcutaneous fat, 2-0 Monocryl for the deep dermal layer, 3-0 running Monocryl subcuticular stitch, and Dermabond for the skin. Once the glue was fully dried, an Aquacell Ag dressing was applied. The patient was transported to the  recovery room in stable condition. Sponge, needle, and instrument counts were correct at the end of the case x2. The patient tolerated the procedure well and there were no known complications.  Postoperatively, the patient will be readmitted to the hospitalist service.  Weightbearing as tolerated with a walker.  Lovenox while in-house for DVT prophylaxis, discharge home on aspirin 81 mg p.o. twice daily with meals for 6 weeks.  She will work with physical therapy and undergo disposition planning.  We will plan for discharge home with home health physical therapy.  Return to the office for routine 2-week postoperative care.

## 2017-10-05 NOTE — Consult Note (Signed)
Reason for Consult:Left hip fx Referring Physician: D Ray  Christina Chase is an 72 y.o. female.  HPI: Christina Chase tripped over a piece of furniture at home. She went up in the air and landed on her left hip. She had immediate pain and could not ambulate. She came to the ED and x-rays showed a left femoral neck fx and orthopedic surgery was consulted. She lives at home with her husband and is very active. She is retired.  History reviewed. No pertinent past medical history.  History reviewed. No pertinent surgical history.  Family History  Problem Relation Age of Onset  . Dementia Mother   . Heart disease Mother   . Heart disease Father   . Hypertension Father   . Cancer Maternal Grandmother        colon    Social History:  reports that she has never smoked. She has never used smokeless tobacco. She reports that she does not drink alcohol or use drugs.  Allergies:  Allergies  Allergen Reactions  . Clindamycin/Lincomycin Diarrhea  . Penicillins     Medications: I have reviewed the patient's current medications.  Results for orders placed or performed during the hospital encounter of 10/05/17 (from the past 48 hour(s))  CBC WITH DIFFERENTIAL     Status: Abnormal   Collection Time: 10/05/17 12:16 PM  Result Value Ref Range   WBC 10.0 4.0 - 10.5 K/uL   RBC 4.97 3.87 - 5.11 MIL/uL   Hemoglobin 14.4 12.0 - 15.0 g/dL   HCT 62.9 52.8 - 41.3 %   MCV 87.1 78.0 - 100.0 fL   MCH 29.0 26.0 - 34.0 pg   MCHC 33.3 30.0 - 36.0 g/dL   RDW 24.4 01.0 - 27.2 %   Platelets 182 150 - 400 K/uL   Neutrophils Relative % 85 %   Neutro Abs 8.5 (H) 1.7 - 7.7 K/uL   Lymphocytes Relative 9 %   Lymphs Abs 0.9 0.7 - 4.0 K/uL   Monocytes Relative 5 %   Monocytes Absolute 0.5 0.1 - 1.0 K/uL   Eosinophils Relative 0 %   Eosinophils Absolute 0.0 0.0 - 0.7 K/uL   Basophils Relative 0 %   Basophils Absolute 0.0 0.0 - 0.1 K/uL   Immature Granulocytes 1 %   Abs Immature Granulocytes 0.1 0.0 - 0.1 K/uL   Comment: Performed at [facility], [address], [place], [place] [address]  APTT     Status: None   Collection Time: 10/05/17 12:16 PM  Result Value Ref Range   aPTT 28 24 - 36 seconds    Comment: Performed at [facility], [address], [place], [place] [address]  Protime-INR     Status: None   Collection Time: 10/05/17 12:16 PM  Result Value Ref Range   Prothrombin Time 12.9 11.4 - 15.2 seconds   INR 0.98     Comment: Performed at [facility], [address] [place], [place] [address]  Type and screen [facility]     Status: None   Collection Time: 10/05/17 12:16 PM  Result Value Ref Range   ABO/RH(D) O POS    Antibody Screen NEG    Sample Expiration      10/08/2017 Performed at [facility], [address] [place], [place] [address]     Dg Chest Port 1 View  Result Date: 10/05/2017 CLINICAL DATA:  Preop for hip fracture. EXAM: PORTABLE CHEST 1 VIEW COMPARISON:  Radiographs  of March 02, 2005. FINDINGS: The heart size and mediastinal contours are within normal limits. Both lungs are clear. The visualized skeletal structures are unremarkable. IMPRESSION: No acute cardiopulmonary abnormality seen. Electronically Signed   By: Lupita RaiderJames  Green Jr, M.D.   On: 10/05/2017 12:34   Dg Hip Unilat With Pelvis 2-3 Views Left  Result Date: 10/05/2017 CLINICAL DATA:  LEFT hip pain after falling onto LEFT hip this morning when she tripped over something EXAM: DG HIP (WITH OR WITHOUT PELVIS) 2-3V LEFT COMPARISON:  None. FINDINGS: Osseous demineralization normal. Joint spaces preserved. Impacted subcapital fracture of the LEFT femoral neck. No dislocation. Pelvis intact. IMPRESSION: Impacted subcapital fracture of the LEFT femoral neck. Electronically Signed   By: Ulyses SouthwardMark  Boles M.D.   On: 10/05/2017 11:53    Review of Systems  Constitutional: Negative for weight loss.  HENT: Negative for ear discharge, ear pain, hearing loss and tinnitus.   Eyes:  Negative for blurred vision, double vision, photophobia and pain.  Respiratory: Negative for cough, sputum production and shortness of breath.   Cardiovascular: Negative for chest pain.  Gastrointestinal: Negative for abdominal pain, nausea and vomiting.  Genitourinary: Negative for dysuria, flank pain, frequency and urgency.  Musculoskeletal: Positive for joint pain (Left hip). Negative for back pain, falls, myalgias and neck pain.  Neurological: Negative for dizziness, tingling, sensory change, focal weakness, loss of consciousness and headaches.  Endo/Heme/Allergies: Does not bruise/bleed easily.  Psychiatric/Behavioral: Negative for depression, memory loss and substance abuse. The patient is not nervous/anxious.    Blood pressure (!) 154/78, pulse 97, temperature 97.9 F (36.6 C), temperature source Oral, resp. rate 18, SpO2 99 %. Physical Exam  Constitutional: She appears well-developed and well-nourished. No distress.  HENT:  Head: Normocephalic and atraumatic.  Eyes: Conjunctivae are normal. Right eye exhibits no discharge. Left eye exhibits no discharge. No scleral icterus.  Neck: Normal range of motion.  Cardiovascular: Normal rate and regular rhythm.  Respiratory: Effort normal. No respiratory distress.  Musculoskeletal:  LLE No traumatic wounds, ecchymosis, or rash  Severe TTP left hip  No knee or ankle effusion but TTP knee, esp lateral  Sens DPN, SPN, TN intact  Motor EHL, ext, flex, evers 5/5  DP 2+, PT 1+, No significant edema  Neurological: She is alert.  Skin: Skin is warm and dry. She is not diaphoretic.  Psychiatric: She has a normal mood and affect. Her behavior is normal.    Assessment/Plan: Left hip fx -- For THA this afternoon by Dr. Linna CapriceSwinteck. Please keep NPO. Pt very intolerant of many medications including pain meds and abx.    Freeman CaldronMichael J. Oley Lahaie, PA-C Orthopedic Surgery [phone] 10/05/2017, 1:21 PM

## 2017-10-05 NOTE — Discharge Instructions (Signed)
°Dr. Cartier Washko °Joint Replacement Specialist °[facility] °[address] °[place], [place] [address] °[phone] ° ° °TOTAL HIP REPLACEMENT POSTOPERATIVE DIRECTIONS ° ° ° °Hip Rehabilitation, Guidelines Following Surgery  ° °WEIGHT BEARING °Weight bearing as tolerated with assist device (walker, cane, etc) as directed, use it as long as suggested by your surgeon or therapist, typically at least 4-6 weeks. ° °The results of a hip operation are greatly improved after range of motion and muscle strengthening exercises. Follow all safety measures which are given to protect your hip. If any of these exercises cause increased pain or swelling in your joint, decrease the amount until you are comfortable again. Then slowly increase the exercises. Call your caregiver if you have problems or questions.  ° °HOME CARE INSTRUCTIONS  °Most of the following instructions are designed to prevent the dislocation of your new hip.  °Remove items at home which could result in a fall. This includes throw rugs or furniture in walking pathways.  °Continue medications as instructed at time of discharge. °· You may have some home medications which will be placed on hold until you complete the course of blood thinner medication. °· You may start showering once you are discharged home. Do not remove your dressing. °Do not put on socks or shoes without following the instructions of your caregivers.   °Sit on chairs with arms. Use the chair arms to help push yourself up when arising.  °Arrange for the use of a toilet seat elevator so you are not sitting low.  °· Walk with walker as instructed.  °You may resume a sexual relationship in one month or when given the OK by your caregiver.  °Use walker as long as suggested by your caregivers.  °You may put full weight on your legs and walk as much as is comfortable. °Avoid periods of inactivity such as sitting longer than an hour when not asleep. This helps prevent  blood clots.  °You may return to work once you are cleared by your surgeon.  °Do not drive a car for 6 weeks or until released by your surgeon.  °Do not drive while taking narcotics.  °Wear elastic stockings for two weeks following surgery during the day but you may remove then at night.  °Make sure you keep all of your appointments after your operation with all of your doctors and caregivers. You should call the office at the above phone number and make an appointment for approximately two weeks after the date of your surgery. °Please pick up a stool softener and laxative for home use as long as you are requiring pain medications. °· ICE to the affected hip every three hours for 30 minutes at a time and then as needed for pain and swelling. Continue to use ice on the hip for pain and swelling from surgery. You may notice swelling that will progress down to the foot and ankle.  This is normal after surgery.  Elevate the leg when you are not up walking on it.   °It is important for you to complete the blood thinner medication as prescribed by your doctor. °· Continue to use the breathing machine which will help keep your temperature down.  It is common for your temperature to cycle up and down following surgery, especially at night when you are not up moving around and exerting yourself.  The breathing machine keeps your lungs expanded and your temperature down. ° °RANGE OF MOTION AND STRENGTHENING EXERCISES  °These exercises are   designed to help you keep full movement of your hip joint. Follow your caregiver's or physical therapist's instructions. Perform all exercises about fifteen times, three times per day or as directed. Exercise both hips, even if you have had only one joint replacement. These exercises can be done on a training (exercise) mat, on the floor, on a table or on a bed. Use whatever works the best and is most comfortable for you. Use music or television while you are exercising so that the exercises  are a pleasant break in your day. This will make your life better with the exercises acting as a break in routine you can look forward to.  °Lying on your back, slowly slide your foot toward your buttocks, raising your knee up off the floor. Then slowly slide your foot back down until your leg is straight again.  °Lying on your back spread your legs as far apart as you can without causing discomfort.  °Lying on your side, raise your upper leg and foot straight up from the floor as far as is comfortable. Slowly lower the leg and repeat.  °Lying on your back, tighten up the muscle in the front of your thigh (quadriceps muscles). You can do this by keeping your leg straight and trying to raise your heel off the floor. This helps strengthen the largest muscle supporting your knee.  °Lying on your back, tighten up the muscles of your buttocks both with the legs straight and with the knee bent at a comfortable angle while keeping your heel on the floor.  ° °SKILLED REHAB INSTRUCTIONS: °If the patient is transferred to a skilled rehab facility following release from the hospital, a list of the current medications will be sent to the facility for the patient to continue.  When discharged from the skilled rehab facility, please have the facility set up the patient's Home Health Physical Therapy prior to being released. Also, the skilled facility will be responsible for providing the patient with their medications at time of release from the facility to include their pain medication and their blood thinner medication. If the patient is still at the rehab facility at time of the two week follow up appointment, the skilled rehab facility will also need to assist the patient in arranging follow up appointment in our office and any transportation needs. ° °MAKE SURE YOU:  °Understand these instructions.  °Will watch your condition.  °Will get help right away if you are not doing well or get worse. ° °Pick up stool softner and  laxative for home use following surgery while on pain medications. °Do not remove your dressing. °The dressing is waterproof--it is OK to take showers. °Continue to use ice for pain and swelling after surgery. °Do not use any lotions or creams on the incision until instructed by your surgeon. °Total Hip Protocol. ° ° °

## 2017-10-05 NOTE — ED Provider Notes (Signed)
[facility] EMERGENCY DEPARTMENT Provider Note   CSN: [id number] Arrival date & time: 10/05/17  1049     History   Chief Complaint Chief Complaint  Patient presents with  . Hip Pain  . Fall    HPI Jayleigh Notarianni is a 72 y.o. female.  HPI 72 year old female who tripped and fell today and has pain to her left hip.  She denies any syncope.  She fell in her home home.  She did not strike her head and has no current headache or neck pain.  She denies numbness tingling or weakness in her left leg. History reviewed. No pertinent past medical history.  There are no active problems to display for this patient.   History reviewed. No pertinent surgical history.   OB History   None      Home Medications    Prior to Admission medications   Medication Sig Start Date End Date Taking? Authorizing Provider  B Complex Vitamins (VITAMIN-B COMPLEX PO) Take 1 tablet by mouth daily.   Yes [provider]  Lactobacillus (PROBIOTIC ACIDOPHILUS) CAPS Take 1 capsule by mouth daily.   Yes [provider]  vitamin C (ASCORBIC ACID) 250 MG tablet Take 250 mg by mouth daily.   Yes [provider]  vitamin E (VITAMIN E) 200 UNIT capsule Take 200 Units by mouth daily.   Yes [provider]    Family History Family History  Problem Relation Age of Onset  . Dementia Mother   . Heart disease Mother   . Heart disease Father   . Hypertension Father   . Cancer Maternal Grandmother        colon    Social History Social History   Tobacco Use  . Smoking status: Never Smoker  . Smokeless tobacco: Never Used  Substance Use Topics  . Alcohol use: No  . Drug use: No     Allergies   Clindamycin/lincomycin and Penicillins   Review of Systems Review of Systems  All other systems reviewed and are negative.    Physical Exam Updated Vital Signs BP (!) 154/78   Pulse 97   Temp 97.9 F (36.6 C) (Oral)   Resp 18   SpO2 99%    Physical Exam  Constitutional: She is oriented to person, place, and time. She appears well-developed and well-nourished.  HENT:  Head: Normocephalic and atraumatic.  Right Ear: External ear normal.  Left Ear: External ear normal.  Mouth/Throat: Oropharynx is clear and moist.  Eyes: Pupils are equal, round, and reactive to light. EOM are normal.  Neck: Normal range of motion. Neck supple.  Cardiovascular: Normal rate, regular rhythm and normal heart sounds.  Pulmonary/Chest: Effort normal and breath sounds normal.  Abdominal: Soft. Bowel sounds are normal.  Musculoskeletal:  Pain palpation of her left hip. No signs of open fracture Sensation intact distal to injury Dorsal talus pulses intact  Neurological: She is alert and oriented to person, place, and time.  Skin: Skin is warm. Capillary refill takes less than 2 seconds.  Psychiatric: She has a normal mood and affect.  Nursing note and vitals reviewed.    ED Treatments / Results  Labs (all labs ordered are listed, but only abnormal results are displayed) Labs Reviewed  CBC WITH DIFFERENTIAL/PLATELET - Abnormal; Notable for the following components:      Result Value   Neutro Abs 8.5 (*)    All other components within normal limits  URINE CULTURE  APTT  PROTIME-INR  BASIC  METABOLIC PANEL  TYPE AND SCREEN  ABO/RH    EKG None  Radiology Dg Chest Port 1 View  Result Date: 10/05/2017 CLINICAL DATA:  Preop for hip fracture. EXAM: PORTABLE CHEST 1 VIEW COMPARISON:  Radiographs of March 02, 2005. FINDINGS: The heart size and mediastinal contours are within normal limits. Both lungs are clear. The visualized skeletal structures are unremarkable. IMPRESSION: No acute cardiopulmonary abnormality seen. Electronically Signed   By: Lupita RaiderJames  Green Jr, M.D.   On: 10/05/2017 12:34   Dg Hip Unilat With Pelvis 2-3 Views Left  Result Date: 10/05/2017 CLINICAL DATA:  LEFT hip pain after falling onto LEFT hip this morning when she  tripped over something EXAM: DG HIP (WITH OR WITHOUT PELVIS) 2-3V LEFT COMPARISON:  None. FINDINGS: Osseous demineralization normal. Joint spaces preserved. Impacted subcapital fracture of the LEFT femoral neck. No dislocation. Pelvis intact. IMPRESSION: Impacted subcapital fracture of the LEFT femoral neck. Electronically Signed   By: Ulyses SouthwardMark  Boles M.D.   On: 10/05/2017 11:53    Procedures Procedures (including critical care time)  Medications Ordered in ED Medications  fentaNYL (SUBLIMAZE) injection 50 mcg (has no administration in time range)     Initial Impression / Assessment and Plan / ED Course  I have reviewed the triage vital signs and the nursing notes.  Pertinent labs & imaging results that were available during my care of the patient were reviewed by me and considered in my medical decision making (see chart for details).    72 year old female trip and fall presents today with left hip fracture.  Care discussed with Dale DurhamMichael Jeffries, PA-C, on-call for orthopedic surgery.  Patient care discussed with Dr. Debe CoderEmily Mullen on-call for hospitalist.  Final Clinical Impressions(s) / ED Diagnoses   Final diagnoses:  Closed left hip fracture, initial encounter [facility](HCC)    ED Discharge Orders    None       Margarita Grizzleay, Terrilee Dudzik, MD 10/05/17 1329

## 2017-10-05 NOTE — H&P (Signed)
History and Physical    Christina Chase:[id number]:[id number] DOB: [birth year] DOA: 10/05/2017  PCP: Donita BrooksPickard, Warren T, MD  Patient coming from: Home Chief Complaint: Hip pain  HPI: Christina Chase is a 72 y.o. female with medical history significant of sensitivity to medications who presents for a fall and a hip fracture.  Christina Chase has no significant medical history per her.  She was in her home today when she stepped the wrong way and tripped over some furniture.  She fell on her left side and had hip pain.  She had no numbness or tingling in the leg.  No prodromal symptoms.  She did not lose consciousness.  She recently had dental work and was on clindamycin for this.  She reports sensitivity to the medication with 1 day of diarrhea and since that time she has had an unsettled stomach and needed to eat bland food.  She does not feel that this contributed to her trip and fall.  She reports having a DEXA in the past, but I do not see one in her file.   ED Course: In the ED, she had imaging which showed a left hip fracture.  Orthopedics has evaluated her and plan to take her to the OR so she is NPO.  Her Na was mildly low at 133.  Cr was stable.  Hgb was stable.  She did not receive any pain medications based on preference.    Review of Systems: As per HPI otherwise 10 point review of systems negative.    History reviewed. No pertinent past medical history.  She reports only being sensitive to medications and preferring "natural" remedies for things.   History reviewed. She has had a recent dental procedure.   Reviewed with patient.   reports that she has never smoked. She has never used smokeless tobacco. She reports that she does not drink alcohol or use drugs.  Allergies  Allergen Reactions  . Clindamycin/Lincomycin Diarrhea  . Penicillins    Reviewed with patient Family History  Problem Relation Age of Onset  . Dementia Mother   . Heart disease Mother   . Heart disease Father   .  Hypertension Father   . Cancer Maternal Grandmother        colon    Prior to Admission medications   Medication Sig Start Date End Date Taking? Authorizing Provider  B Complex Vitamins (VITAMIN-B COMPLEX PO) Take 1 tablet by mouth daily.   Yes [provider]  Lactobacillus (PROBIOTIC ACIDOPHILUS) CAPS Take 1 capsule by mouth daily.   Yes [provider]  vitamin C (ASCORBIC ACID) 250 MG tablet Take 250 mg by mouth daily.   Yes [provider]  vitamin E (VITAMIN E) 200 UNIT capsule Take 200 Units by mouth daily.   Yes [provider]    Physical Exam: Constitutional: Lying in bed, some pain with movement Vitals:   10/05/17 1400 10/05/17 1415 10/05/17 1430 10/05/17 1523  BP: (!) 170/95 132/86 123/80   Pulse: 97 94 95   Resp: (!) 22 14 (!) 25   Temp:      TempSrc:      SpO2: 98% 100% 96%   Weight:    115 lb (52.2 kg)  Height:    5\' 3"  (1.6 m)   Eyes: lids and conjunctivae normal ENMT: Mucous membranes are moist. Normal dentition.  Neck: normal, supple Respiratory: CTAB, no wheezing or rales.  Good effort Cardiovascular: RR, NR, no murmur noted Abdomen: no tenderness.  Bowel  sounds positive.  Musculoskeletal: no clubbing / cyanosis. Her left leg is not turned or everted.  She has good pulses in the bilateral DP and equal sensation in the legs Skin: no rashes, lesions, ulcers on exposed skin Neurologic: Strength is intact in the right LE and upper extremities.  Sensation is intact throughout.  Psychiatric: Alert and oriented x 3. Normal mood.    Labs on Admission: I have personally reviewed following labs and imaging studies  CBC: Recent Labs  Lab 10/05/17 1216  WBC 10.0  NEUTROABS 8.5*  HGB 14.4  HCT 43.3  MCV 87.1  PLT 182   Basic Metabolic Panel: Recent Labs  Lab 10/05/17 1216  NA 133*  K 3.5  CL 97*  CO2 24  GLUCOSE 124*  BUN 6*  CREATININE 0.58  CALCIUM 10.8*   GFR: Estimated Creatinine Clearance: 50.8 mL/min (by  C-G formula based on SCr of 0.58 mg/dL). Liver Function Tests: No results for input(s): AST, ALT, ALKPHOS, BILITOT, PROT, ALBUMIN in the last 168 hours. No results for input(s): LIPASE, AMYLASE in the last 168 hours. No results for input(s): AMMONIA in the last 168 hours. Coagulation Profile: Recent Labs  Lab 10/05/17 1216  INR 0.98   Cardiac Enzymes: No results for input(s): CKTOTAL, CKMB, CKMBINDEX, TROPONINI in the last 168 hours. BNP (last 3 results) No results for input(s): PROBNP in the last 8760 hours. HbA1C: No results for input(s): HGBA1C in the last 72 hours. CBG: No results for input(s): GLUCAP in the last 168 hours. Lipid Profile: No results for input(s): CHOL, HDL, LDLCALC, TRIG, CHOLHDL, LDLDIRECT in the last 72 hours. Thyroid Function Tests: No results for input(s): TSH, T4TOTAL, FREET4, T3FREE, THYROIDAB in the last 72 hours. Anemia Panel: No results for input(s): VITAMINB12, FOLATE, FERRITIN, TIBC, IRON, RETICCTPCT in the last 72 hours. Urine analysis: No results found for: COLORURINE, APPEARANCEUR, LABSPEC, PHURINE, GLUCOSEU, HGBUR, BILIRUBINUR, KETONESUR, PROTEINUR, UROBILINOGEN, NITRITE, LEUKOCYTESUR  Radiological Exams on Admission: Dg Chest Port 1 View  Result Date: 10/05/2017 CLINICAL DATA:  Preop for hip fracture. EXAM: PORTABLE CHEST 1 VIEW COMPARISON:  Radiographs of March 02, 2005. FINDINGS: The heart size and mediastinal contours are within normal limits. Both lungs are clear. The visualized skeletal structures are unremarkable. IMPRESSION: No acute cardiopulmonary abnormality seen. Electronically Signed   By: Lupita Raider, M.D.   On: 10/05/2017 12:34   Dg Knee Left Port  Result Date: 10/05/2017 CLINICAL DATA:  A left femoral neck fracture. Clinical concern for a concomitant left knee fracture. EXAM: PORTABLE LEFT KNEE - 1-2 VIEW COMPARISON:  None. FINDINGS: Normal appearing bones and soft tissues without fracture, dislocation or effusion.  IMPRESSION: No fracture. Electronically Signed   By: Beckie Salts M.D.   On: 10/05/2017 13:43   Dg Hip Unilat With Pelvis 2-3 Views Left  Result Date: 10/05/2017 CLINICAL DATA:  LEFT hip pain after falling onto LEFT hip this morning when she tripped over something EXAM: DG HIP (WITH OR WITHOUT PELVIS) 2-3V LEFT COMPARISON:  None. FINDINGS: Osseous demineralization normal. Joint spaces preserved. Impacted subcapital fracture of the LEFT femoral neck. No dislocation. Pelvis intact. IMPRESSION: Impacted subcapital fracture of the LEFT femoral neck. Electronically Signed   By: Ulyses Southward M.D.   On: 10/05/2017 11:53    EKG: Independently reviewed. Poor tracing, but appears to be normal sinus rhythm.    Assessment/Plan  Hip fracture  - Noted with fall from standing height, likely fragility fracture given age.  I do not see a DEXA on  file for her - Consider starting bisphosphonate after surgery - Pain management is going to be problematic for her given she cannot tolerate opiates, nsaids or tylenol.  Further discussion with patient.  Possibly lidocaine patches? - Surgery today, further post op management per orthopedics  Mild hypercalcemia, unknown cause - Possibly related to acute fracture - Will recheck in the AM for confirmation, if remains elevated would check a PTH and further work up based on that finding - She did not mention taking any calcium supplementation, will need to confirm with patient (now in OR) - CMET in the AM with albumin to confirm  Sensitivity to medications - Avoid starting new medications without discussion with patient, she has refused any pain medications to me today.    DVT prophylaxis: Heparin SQ, SCDs Code Status: Full Family Communication: Husband at bedside Disposition Plan: Admit for surgery and post op planning, possible rehab discharge Consults called: Orthopedics, Dr. Linna Caprice Admission status: Inpatient, med surg   Debe Coder MD [facility] Pager [phone]  If 7PM-7AM, please contact night-coverage [email or web address] Password [facility]  10/05/2017, 3:31 PM

## 2017-10-05 NOTE — Anesthesia Preprocedure Evaluation (Addendum)
Anesthesia Evaluation  Patient identified by MRN, date of birth, ID band Patient awake    Reviewed: Allergy & Precautions, NPO status , Patient's Chart, lab work & pertinent test results  History of Anesthesia Complications Negative for: history of anesthetic complications  Airway Mallampati: II  TM Distance: >3 FB Neck ROM: Full    Dental no notable dental hx. (+) Dental Advisory Given   Pulmonary neg pulmonary ROS,    Pulmonary exam normal breath sounds clear to auscultation       Cardiovascular negative cardio ROS Normal cardiovascular exam Rhythm:Regular Rate:Normal     Neuro/Psych negative neurological ROS  negative psych ROS   GI/Hepatic negative GI ROS, Neg liver ROS,   Endo/Other  negative endocrine ROS  Renal/GU negative Renal ROS  negative genitourinary   Musculoskeletal negative musculoskeletal ROS (+)   Abdominal   Peds negative pediatric ROS (+)  Hematology negative hematology ROS (+)   Anesthesia Other Findings   Reproductive/Obstetrics negative OB ROS                            Anesthesia Physical Anesthesia Plan  ASA: II  Anesthesia Plan: Spinal and MAC   Post-op Pain Management:    Induction: Intravenous  PONV Risk Score and Plan: 2 and Ondansetron and Propofol infusion  Airway Management Planned: Natural Airway and Simple Face Mask  Additional Equipment:   Intra-op Plan:   Post-operative Plan: Extubation in OR  Informed Consent: I have reviewed the patients History and Physical, chart, labs and discussed the procedure including the risks, benefits and alternatives for the proposed anesthesia with the patient or authorized representative who has indicated his/her understanding and acceptance.   Dental advisory given  Plan Discussed with: CRNA and Anesthesiologist  Anesthesia Plan Comments:        Anesthesia Quick Evaluation

## 2017-10-05 NOTE — ED Notes (Signed)
Patient offered pain medicine, she declined stating, "Aspirin will bring down my respirations, I don't want it."

## 2017-10-05 NOTE — Care Management Note (Signed)
Case Management Note  Patient Details  Name: Vernice Jeffersonatricia Hocutt MRN: [id number] Date of Birth: [birth year]  Subjective/Objective:                  72 y.o. female.  HPI: Elease Hashimotoatricia tripped over a piece of furniture at home. She went up in the air and landed on her left hip. She had immediate pain and could not ambulate.  From home with spouse.   Action/Plan: Admit status INPATIENT (THA); anticipate discharge HOME WITH HH VS SNF.   Expected Discharge Date:  (unknown)               Expected Discharge Plan:  Home w Home Health Services  In-House Referral:     Discharge planning Services  CM Consult  Post Acute Care Choice:    Choice offered to:     DME Arranged:    DME Agency:     HH Arranged:    HH Agency:     Status of Service:     If discussed at [employer] Meetings, dates discussed:    Additional Comments:  Oletta CohnWood, Kayleana Waites, RN 10/05/2017, 2:51 PM

## 2017-10-05 NOTE — Interval H&P Note (Signed)
History and Physical Interval Note:  10/05/2017 3:27 PM  Christina JeffersonPatricia Chase  has presented today for surgery, with the diagnosis of Left femoral neck fx  The various methods of treatment have been discussed with the patient and family. After consideration of risks, benefits and other options for treatment, the patient has consented to  Procedure(s): TOTAL HIP ARTHROPLASTY ANTERIOR APPROACH (Left) as a surgical intervention .  The patient's history has been reviewed, patient examined, no change in status, stable for surgery.  I have reviewed the patient's chart and labs.  Questions were answered to the patient's satisfaction.    The risks, benefits, and alternatives were discussed with the patient. There are risks associated with the surgery including, but not limited to, problems with anesthesia (death), infection, instability (giving out of the joint), dislocation, differences in leg length/angulation/rotation, fracture of bones, loosening or failure of implants, hematoma (blood accumulation) which may require surgical drainage, blood clots, pulmonary embolism, nerve injury (foot drop and lateral thigh numbness), and blood vessel injury. The patient understands these risks and elects to proceed.   Iline OvenBrian J Jacobi Ryant

## 2017-10-05 NOTE — H&P (View-Only) (Signed)
Reason for Consult:Left hip fx Referring Physician: D Ray  Christina Chase is an 72 y.o. female.  HPI: Christina Chase tripped over a piece of furniture at home. She went up in the air and landed on her left hip. She had immediate pain and could not ambulate. She came to the ED and x-rays showed a left femoral neck fx and orthopedic surgery was consulted. She lives at home with her husband and is very active. She is retired.  History reviewed. No pertinent past medical history.  History reviewed. No pertinent surgical history.  Family History  Problem Relation Age of Onset  . Dementia Mother   . Heart disease Mother   . Heart disease Father   . Hypertension Father   . Cancer Maternal Grandmother        colon    Social History:  reports that she has never smoked. She has never used smokeless tobacco. She reports that she does not drink alcohol or use drugs.  Allergies:  Allergies  Allergen Reactions  . Clindamycin/Lincomycin Diarrhea  . Penicillins     Medications: I have reviewed the patient's current medications.  Results for orders placed or performed during the hospital encounter of 10/05/17 (from the past 48 hour(s))  CBC WITH DIFFERENTIAL     Status: Abnormal   Collection Time: 10/05/17 12:16 PM  Result Value Ref Range   WBC 10.0 4.0 - 10.5 K/uL   RBC 4.97 3.87 - 5.11 MIL/uL   Hemoglobin 14.4 12.0 - 15.0 g/dL   HCT 43.3 36.0 - 46.0 %   MCV 87.1 78.0 - 100.0 fL   MCH 29.0 26.0 - 34.0 pg   MCHC 33.3 30.0 - 36.0 g/dL   RDW 13.7 11.5 - 15.5 %   Platelets 182 150 - 400 K/uL   Neutrophils Relative % 85 %   Neutro Abs 8.5 (H) 1.7 - 7.7 K/uL   Lymphocytes Relative 9 %   Lymphs Abs 0.9 0.7 - 4.0 K/uL   Monocytes Relative 5 %   Monocytes Absolute 0.5 0.1 - 1.0 K/uL   Eosinophils Relative 0 %   Eosinophils Absolute 0.0 0.0 - 0.7 K/uL   Basophils Relative 0 %   Basophils Absolute 0.0 0.0 - 0.1 K/uL   Immature Granulocytes 1 %   Abs Immature Granulocytes 0.1 0.0 - 0.1 K/uL   Comment: Performed at [facility], [address] [place], [place] [address]  APTT     Status: None   Collection Time: 10/05/17 12:16 PM  Result Value Ref Range   aPTT 28 24 - 36 seconds    Comment: Performed at [facility], [address] [place], [place] [address]  Protime-INR     Status: None   Collection Time: 10/05/17 12:16 PM  Result Value Ref Range   Prothrombin Time 12.9 11.4 - 15.2 seconds   INR 0.98     Comment: Performed at [facility], [address] [place], [place] [address]  Type and screen [facility]     Status: None   Collection Time: 10/05/17 12:16 PM  Result Value Ref Range   ABO/RH(D) O POS    Antibody Screen NEG    Sample Expiration      10/08/2017 Performed at  Hospital Lab, [address] [place], [place] [address]     Dg Chest Port 1 View  Result Date: 10/05/2017 CLINICAL DATA:  Preop for hip fracture. EXAM: PORTABLE CHEST 1 VIEW COMPARISON:  Radiographs   of March 02, 2005. FINDINGS: The heart size and mediastinal contours are within normal limits. Both lungs are clear. The visualized skeletal structures are unremarkable. IMPRESSION: No acute cardiopulmonary abnormality seen. Electronically Signed   By: James  Green Jr, M.D.   On: 10/05/2017 12:34   Dg Hip Unilat With Pelvis 2-3 Views Left  Result Date: 10/05/2017 CLINICAL DATA:  LEFT hip pain after falling onto LEFT hip this morning when she tripped over something EXAM: DG HIP (WITH OR WITHOUT PELVIS) 2-3V LEFT COMPARISON:  None. FINDINGS: Osseous demineralization normal. Joint spaces preserved. Impacted subcapital fracture of the LEFT femoral neck. No dislocation. Pelvis intact. IMPRESSION: Impacted subcapital fracture of the LEFT femoral neck. Electronically Signed   By: Mark  Boles M.D.   On: 10/05/2017 11:53    Review of Systems  Constitutional: Negative for weight loss.  HENT: Negative for ear discharge, ear pain, hearing loss and tinnitus.   Eyes:  Negative for blurred vision, double vision, photophobia and pain.  Respiratory: Negative for cough, sputum production and shortness of breath.   Cardiovascular: Negative for chest pain.  Gastrointestinal: Negative for abdominal pain, nausea and vomiting.  Genitourinary: Negative for dysuria, flank pain, frequency and urgency.  Musculoskeletal: Positive for joint pain (Left hip). Negative for back pain, falls, myalgias and neck pain.  Neurological: Negative for dizziness, tingling, sensory change, focal weakness, loss of consciousness and headaches.  Endo/Heme/Allergies: Does not bruise/bleed easily.  Psychiatric/Behavioral: Negative for depression, memory loss and substance abuse. The patient is not nervous/anxious.    Blood pressure (!) 154/78, pulse 97, temperature 97.9 F (36.6 C), temperature source Oral, resp. rate 18, SpO2 99 %. Physical Exam  Constitutional: She appears well-developed and well-nourished. No distress.  HENT:  Head: Normocephalic and atraumatic.  Eyes: Conjunctivae are normal. Right eye exhibits no discharge. Left eye exhibits no discharge. No scleral icterus.  Neck: Normal range of motion.  Cardiovascular: Normal rate and regular rhythm.  Respiratory: Effort normal. No respiratory distress.  Musculoskeletal:  LLE No traumatic wounds, ecchymosis, or rash  Severe TTP left hip  No knee or ankle effusion but TTP knee, esp lateral  Sens DPN, SPN, TN intact  Motor EHL, ext, flex, evers 5/5  DP 2+, PT 1+, No significant edema  Neurological: She is alert.  Skin: Skin is warm and dry. She is not diaphoretic.  Psychiatric: She has a normal mood and affect. Her behavior is normal.    Assessment/Plan: Left hip fx -- For THA this afternoon by Dr. Swinteck. Please keep NPO. Pt very intolerant of many medications including pain meds and abx.    Callaway Hardigree J. Cardin Nitschke, PA-C Orthopedic Surgery [phone] 10/05/2017, 1:21 PM  

## 2017-10-05 NOTE — Anesthesia Procedure Notes (Signed)
Procedure Name: MAC Date/Time: 10/05/2017 4:16 PM Performed by: White, Amedeo Plenty, CRNA Pre-anesthesia Checklist: Patient identified, Emergency Drugs available, Suction available and Patient being monitored Patient Re-evaluated:Patient Re-evaluated prior to induction Oxygen Delivery Method: Nasal cannula

## 2017-10-05 NOTE — ED Triage Notes (Signed)
Pt in after a fall PTA, landed on her left shoulder and left hip, pt unable to bear any weight on her left leg, concerned for hip fx, denies hitting head

## 2017-10-06 ENCOUNTER — Inpatient Hospital Stay (HOSPITAL_COMMUNITY): Payer: Medicare Other

## 2017-10-06 DIAGNOSIS — S72002A Fracture of unspecified part of neck of left femur, initial encounter for closed fracture: Secondary | ICD-10-CM

## 2017-10-06 LAB — COMPREHENSIVE METABOLIC PANEL
ALT: 22 U/L (ref 0–44)
AST: 38 U/L (ref 15–41)
Albumin: 3.6 g/dL (ref 3.5–5.0)
Alkaline Phosphatase: 46 U/L (ref 38–126)
Anion gap: 7 (ref 5–15)
BUN: <5 mg/dL — ABNORMAL LOW (ref 8–23)
CALCIUM: 8.8 mg/dL — AB (ref 8.9–10.3)
CHLORIDE: 98 mmol/L (ref 98–111)
CO2: 23 mmol/L (ref 22–32)
CREATININE: 0.44 mg/dL (ref 0.44–1.00)
Glucose, Bld: 118 mg/dL — ABNORMAL HIGH (ref 70–99)
Potassium: 3.7 mmol/L (ref 3.5–5.1)
Sodium: 128 mmol/L — ABNORMAL LOW (ref 135–145)
TOTAL PROTEIN: 5.6 g/dL — AB (ref 6.5–8.1)
Total Bilirubin: 1.9 mg/dL — ABNORMAL HIGH (ref 0.3–1.2)

## 2017-10-06 LAB — URINE CULTURE

## 2017-10-06 LAB — CBC
HCT: 32.4 % — ABNORMAL LOW (ref 36.0–46.0)
Hemoglobin: 10.9 g/dL — ABNORMAL LOW (ref 12.0–15.0)
MCH: 29.1 pg (ref 26.0–34.0)
MCHC: 33.6 g/dL (ref 30.0–36.0)
MCV: 86.4 fL (ref 78.0–100.0)
Platelets: 150 10*3/uL (ref 150–400)
RBC: 3.75 MIL/uL — AB (ref 3.87–5.11)
RDW: 13.6 % (ref 11.5–15.5)
WBC: 7.6 10*3/uL (ref 4.0–10.5)

## 2017-10-06 MED ORDER — ENOXAPARIN SODIUM 40 MG/0.4ML ~~LOC~~ SOLN
40.0000 mg | SUBCUTANEOUS | Status: DC
  Administered 2017-10-06 – 2017-10-09 (×4): 40 mg via SUBCUTANEOUS
  Filled 2017-10-06 (×4): qty 0.4

## 2017-10-06 MED ORDER — MORPHINE SULFATE (PF) 2 MG/ML IV SOLN
0.5000 mg | INTRAVENOUS | Status: DC | PRN
  Filled 2017-10-06: qty 1

## 2017-10-06 MED ORDER — HYDROCODONE-ACETAMINOPHEN 7.5-325 MG PO TABS
1.0000 | ORAL_TABLET | ORAL | Status: DC | PRN
  Filled 2017-10-06: qty 1

## 2017-10-06 MED ORDER — VANCOMYCIN HCL IN DEXTROSE 1-5 GM/200ML-% IV SOLN
1000.0000 mg | Freq: Two times a day (BID) | INTRAVENOUS | Status: AC
  Administered 2017-10-06: 1000 mg via INTRAVENOUS
  Filled 2017-10-06: qty 200

## 2017-10-06 MED ORDER — ACETAMINOPHEN 325 MG PO TABS: 325.0000 mg | ORAL_TABLET | Freq: Four times a day (QID) | ORAL | Status: DC | PRN

## 2017-10-06 MED ORDER — METOCLOPRAMIDE HCL 5 MG PO TABS: 5.0000 mg | ORAL_TABLET | Freq: Three times a day (TID) | ORAL | Status: DC | PRN

## 2017-10-06 MED ORDER — ONDANSETRON HCL 4 MG/2ML IJ SOLN
4.0000 mg | Freq: Four times a day (QID) | INTRAMUSCULAR | Status: DC | PRN
  Filled 2017-10-06: qty 2

## 2017-10-06 MED ORDER — DOCUSATE SODIUM 100 MG PO CAPS
100.0000 mg | ORAL_CAPSULE | Freq: Two times a day (BID) | ORAL | Status: DC
  Filled 2017-10-06 (×3): qty 1

## 2017-10-06 MED ORDER — MENTHOL 3 MG MT LOZG: 1.0000 | LOZENGE | OROMUCOSAL | Status: DC | PRN

## 2017-10-06 MED ORDER — ONDANSETRON HCL 4 MG PO TABS: 4.0000 mg | ORAL_TABLET | Freq: Four times a day (QID) | ORAL | Status: DC | PRN

## 2017-10-06 MED ORDER — PHENOL 1.4 % MT LIQD: 1.0000 | OROMUCOSAL | Status: DC | PRN

## 2017-10-06 MED ORDER — HYDROCODONE-ACETAMINOPHEN 5-325 MG PO TABS: 1.0000 | ORAL_TABLET | ORAL | Status: DC | PRN

## 2017-10-06 MED ORDER — METOCLOPRAMIDE HCL 5 MG/ML IJ SOLN: 5.0000 mg | Freq: Three times a day (TID) | INTRAMUSCULAR | Status: DC | PRN

## 2017-10-06 NOTE — Progress Notes (Signed)
   Subjective:  Patient reports pain as mild.  Most of her pain is in her abdomen as she has intense hunger pains, stating that she is not able to eat any processed sugar or carbohydrates and has not been fed yet today.  She states the food is supposed to be on the way.  She is mostly perseverating about her hunger at this time.  She certainly does does deny any nausea or vomiting and denies any chest pain.  Objective:   VITALS:   Vitals:   10/05/17 1935 10/05/17 2021 10/06/17 0038 10/06/17 0405  BP: 118/73 (!) 116/102 120/68 103/62  Pulse: 94 90 87 98  Resp: 17 14 14 14   Temp: (!) 97.5 F (36.4 C) 98.4 F (36.9 C) 98.9 F (37.2 C) 98.5 F (36.9 C)  TempSrc:  Oral Oral Oral  SpO2: 98% 98% 97% 98%  Weight:      Height:        Neurologically intact Sensation intact distally Intact pulses distally Dorsiflexion/Plantar flexion intact Incision: dressing C/D/I   Lab Results  Component Value Date   WBC 7.6 10/06/2017   HGB 10.9 (L) 10/06/2017   HCT 32.4 (L) 10/06/2017   MCV 86.4 10/06/2017   PLT 150 10/06/2017   BMET    Component Value Date/Time   NA 128 (L) 10/06/2017 0522   K 3.7 10/06/2017 0522   CL 98 10/06/2017 0522   CO2 23 10/06/2017 0522   GLUCOSE 118 (H) 10/06/2017 0522   BUN <5 (L) 10/06/2017 0522   CREATININE 0.44 10/06/2017 0522   CREATININE 0.61 07/07/2016 1039   CALCIUM 8.8 (L) 10/06/2017 0522   GFRNONAA >60 10/06/2017 0522   GFRNONAA >89 07/07/2016 1039   GFRAA >60 10/06/2017 0522   GFRAA >89 07/07/2016 1039     Assessment/Plan: 1 Day Post-Op   Active Problems:   Hip fracture (HCC)   Displaced fracture of left femoral neck (HCC)   Advance diet Up with therapy -Okay for weightbearing as tolerated to the left lower extremity with walker. -Maintain postoperative bandage until follow-up appointment. -Lovenox for DVT prophylaxis with SCDs while in the hospital.  She will discharge home on twice daily baby aspirin for 6 weeks.   Yolonda KidaJason Patrick  Rogers 10/06/2017, 10:14 AM   Christina RuedJason P Rogers, MD [phone]

## 2017-10-06 NOTE — Evaluation (Signed)
Physical Therapy Evaluation Patient Details Name: Christina Chase MRN: [id number] DOB: Dec 24, 1945 Today's Date: 10/06/2017   History of Present Illness  Pt is a 72 y.o. female with medical history significant of sensitivity to medications who presented to the ED after a fall at home. Xray revealed L hip fx. She underwent L THA 10-05-17.      Clinical Impression  Pt admitted with above diagnosis. Pt currently with functional limitations due to the deficits listed below (see PT Problem List). On eval, pt required mod assist bed mobility, min assist sit to stand, and min assist ambulation 15 feet with RW. Mobility limited by pain and nausea. Pt will benefit from skilled PT to increase their independence and safety with mobility to allow discharge to the venue listed below.       Follow Up Recommendations Home health PT;Supervision/Assistance - 24 hour    Equipment Recommendations  Rolling walker with 5" wheels;3in1 (PT)    Recommendations for Other Services       Precautions / Restrictions Precautions Precautions: Fall Restrictions Weight Bearing Restrictions: Yes LLE Weight Bearing: Weight bearing as tolerated      Mobility  Bed Mobility Overal bed mobility: Needs Assistance Bed Mobility: Supine to Sit;Sit to Supine     Supine to sit: Mod assist;HOB elevated Sit to supine: Mod assist;HOB elevated   General bed mobility comments: +rail, cues for sequencing, increased time and effort  Transfers Overall transfer level: Needs assistance Equipment used: Rolling walker (2 wheeled) Transfers: Sit to/from Stand Sit to Stand: Min assist         General transfer comment: cues for hand placement, assist to power up, increased time and effort, increased time to stabilize initial standing balance  Ambulation/Gait Ambulation/Gait assistance: Min assist Gait Distance (Feet): 15 Feet Assistive device: Rolling walker (2 wheeled) Gait Pattern/deviations: Step-to pattern;Decreased  stride length Gait velocity: decreased Gait velocity interpretation: <1.8 ft/sec, indicate of risk for recurrent falls General Gait Details: cues for sequencing, assist with RW management. Distance limited by pain and nausea.  Stairs            Wheelchair Mobility    Modified Rankin (Stroke Patients Only)       Balance Overall balance assessment: Needs assistance Sitting-balance support: No upper extremity supported;Feet supported Sitting balance-Leahy Scale: Fair     Standing balance support: Bilateral upper extremity supported;During functional activity Standing balance-Leahy Scale: Poor                               Pertinent Vitals/Pain Pain Assessment: 0-10 Pain Score: 10-Worst pain ever Pain Location: L hip during mobility Pain Descriptors / Indicators: Moaning;Grimacing;Guarding Pain Intervention(s): Limited activity within patient's tolerance;Monitored during session;Repositioned    Home Living Family/patient expects to be discharged to:: Private residence Living Arrangements: Spouse/significant other Available Help at Discharge: Family;Available 24 hours/day Type of Home: House Home Access: Stairs to enter Entrance Stairs-Rails: None Entrance Stairs-Number of Steps: 3 (2 4-inch steps, then 1 step over threshold) Home Layout: One level Home Equipment: None Additional Comments: very small bathrooms and doorways. walk-in shower and bathtub too small to accomedate seat. BR only accessible with RW by side stepping.    Prior Function Level of Independence: Independent               Hand Dominance        Extremity/Trunk Assessment   Upper Extremity Assessment Upper Extremity Assessment: Defer to OT evaluation  Lower Extremity Assessment Lower Extremity Assessment: LLE deficits/detail LLE Deficits / Details: s/p THA LLE: Unable to fully assess due to pain    Cervical / Trunk Assessment Cervical / Trunk Assessment: Normal   Communication   Communication: No difficulties  Cognition Arousal/Alertness: Awake/alert Behavior During Therapy: WFL for tasks assessed/performed Overall Cognitive Status: Within Functional Limits for tasks assessed                                 General Comments: Pt perseverating on hunger and not eating for 2 days prior to sx. She reports intolerance to all food offered at the hospital.       General Comments      Exercises     Assessment/Plan    PT Assessment Patient needs continued PT services  PT Problem List Decreased strength;Decreased mobility;Pain;Decreased knowledge of use of DME;Decreased balance;Decreased activity tolerance       PT Treatment Interventions DME instruction;Therapeutic activities;Gait training;Therapeutic exercise;Patient/family education;Stair training;Balance training;Functional mobility training    PT Goals (Current goals can be found in the Care Plan section)  Acute Rehab PT Goals Patient Stated Goal: not stated PT Goal Formulation: With patient/family Time For Goal Achievement: 10/20/17 Potential to Achieve Goals: Good    Frequency Min 6X/week   Barriers to discharge        Co-evaluation               AM-PAC PT "6 Clicks" Daily Activity  Outcome Measure Difficulty turning over in bed (including adjusting bedclothes, sheets and blankets)?: A Lot Difficulty moving from lying on back to sitting on the side of the bed? : Unable Difficulty sitting down on and standing up from a chair with arms (e.g., wheelchair, bedside commode, etc,.)?: Unable Help needed moving to and from a bed to chair (including a wheelchair)?: A Little Help needed walking in hospital room?: A Little Help needed climbing 3-5 steps with a railing? : A Lot 6 Click Score: 12    End of Session Equipment Utilized During Treatment: Gait belt Activity Tolerance: Patient limited by pain;Treatment limited secondary to medical complications  (Comment)(nausea) Patient left: in bed;with call bell/phone within reach;with family/visitor present Nurse Communication: Mobility status PT Visit Diagnosis: Other abnormalities of gait and mobility (R26.89);Pain;Difficulty in walking, not elsewhere classified (R26.2) Pain - Right/Left: Left Pain - part of body: Hip    Time: 1610-9604 PT Time Calculation (min) (ACUTE ONLY): 20 min   Charges:   PT Evaluation $PT Eval Moderate Complexity: 1 Mod          Aida Raider, PT  Office # [phone] Pager [phone]   Ilda Foil 10/06/2017, 4:11 PM

## 2017-10-06 NOTE — Progress Notes (Signed)
Patient arrived on unit alert and oriented x 4. Has pain, bur insist that it is tolerable and refuses pain meds. Patient using ice pack for hip pain.

## 2017-10-06 NOTE — Plan of Care (Signed)

## 2017-10-06 NOTE — Progress Notes (Addendum)
Initial Nutrition Assessment  DOCUMENTATION CODES:  Not applicable  INTERVENTION:  Spoke with kitchen supervisor to arrange bottled water on patients trays.   Patient largely discontent with hospitals food. Family brought in cooler of food for patient. RD noted how to use menu if they change mind.   Patient declined RD recommendations  NUTRITION DIAGNOSIS:  Increased nutrient needs related to post-op healing as evidenced by estimated nutritional requirements for this condition  GOAL:  Patient will meet greater than or equal to 90% of their needs  MONITOR:  PO intake, Supplement acceptance, Labs, Weight trends, I & O's, Diet advancement  REASON FOR ASSESSMENT:  Consult Hip fracture protocol  ASSESSMENT:  72 y/o female w/o significant PMHx. Follows naturopathic lifestyle. Numerous subjective food/med intolerances. Presents after falling at home and suffered L hip fx s/p L THA.   Patient upset on RD arrival due to not having access to bottled water or having had anything to eat since surgery. She refuses to drink "chlorinated water". RD called and spoke with Kitchen supervisor to arrange for extra bottles of water to come on trays.   She says she cant eat nearly any items on our menu because they will worsen her stomach candida and cause diarrhea. RD asked what other items will cause her candida to worsen, she says "I cant have any carbs, no sugar, only probiotics". She took Lactobacillus acidophilus. She eats a lot of yogurt (anything with probiotics). She claims she MUST eat these only and it is not a preference.   RD returned after she had received some water and food. She says she could not eat the yogurt "because it had sugar in it". Family has brought the patient a cooler of items for her to eat. RD again recommended a supplement for her post-op but she declined, saying "havent you read the ingredients list"  Patient content with managing her nutrition as she sees fit.    Physical exam: deferred  Labs: Na: 128, Albumin:3.6, H/H:10.9/32.4 Meds Colace,   Recent Labs  Lab 10/05/17 1216 10/06/17 0522  NA 133* 128*  K 3.5 3.7  CL 97* 98  CO2 24 23  BUN 6* <5*  CREATININE 0.58 0.44  CALCIUM 10.8* 8.8*  GLUCOSE 124* 118*   NUTRITION - FOCUSED PHYSICAL EXAM: Deferred  Diet Order:   Diet Order           Diet regular Room service appropriate? Yes; Fluid consistency: Thin  Diet effective now         EDUCATION NEEDS:  No education needs have been identified at this time  Skin: Surgical incisions to L hip  Last BM:  Unknown  Height:  Ht Readings from Last 1 Encounters:  10/05/17 5\' 3"  (1.6 m)   Weight:  Wt Readings from Last 1 Encounters:  10/05/17 115 lb (52.2 kg)   Wt Readings from Last 10 Encounters:  10/05/17 115 lb (52.2 kg)  07/07/16 122 lb (55.3 kg)   Ideal Body Weight:  52.27 kg  BMI:  Body mass index is 20.37 kg/m.  Estimated Nutritional Needs:  Kcal:  1600-1850 kcals (31-35 kcal/kg bw) Protein:  68-78 g Pro (1.3-1.5 g/kg bw) Fluid:  >1.6 L fluid (30 ml/kg bw)  Christophe LouisNathan Franks RD, LDN, CNSC Clinical Nutrition Available Tues-Sat via Pager: [phone] 10/06/2017 12:17 PM

## 2017-10-06 NOTE — Progress Notes (Signed)
[facility] PROGRESS NOTE  Christina Chase [id number] DOB: 06/29/1945 DOA: 10/05/2017 PCP: Donita BrooksPickard, Warren T, MD   Narrative: [name]'s past medical history, fall and hip fracture-Swinteck MD consulted for orthopedics-had surgery 8/2   A & Plan Hip fracture-Defer to orthopedics planning weightbearing pain control etc. Hypovolemic hyponatremia plus mild hypercalcemia + mild hypercalcemia on admission was spurious-now in normal range-monitor a.m. labs-would continue NS 50 cc/H and repeat in a.m. Impaired glucose tolerance-sugars 118-124-A1c would correspond to less than 5.2 Mild hyperlipidemia LDL last year 127 total cholesterol 218-outpatient management Mild hyperbilirubinemia-monitor     DVT prophylaxis: Defer to orthopedics code Status: Full code family Communication: None disposition Plan: Probable skilled placement needed after therapy evaluation in the next 2 to 3 days   Mahala MenghiniSamtani, MD  [facility] Direct contact: [phone] --Via amion app OR  --[email or web address]; password [email or web address]  7PM-7AM contact night coverage as above 10/06/2017, 7:18 AM  LOS: 1 day   Consultants:  Swinteck MD  Procedures:  Left total hip arthroplasty anterior approach 8/2  Antimicrobials:  Perioperative  Interval history/Subjective: Awake Seems to have shoulder and leg pains Also very concerned about taking Aspirin for DVt prophy  Objective:  Vitals:  Vitals:   10/06/17 0038 10/06/17 0405  BP: 120/68 103/62  Pulse: 87 98  Resp: 14 14  Temp: 98.9 F (37.2 C) 98.5 F (36.9 C)  SpO2: 97% 98%    Exam:  . Anxious fidgety . eomi slim, mild wasting . No pallor no ict, cta b . No added sound . abd soft nt nd*   I have personally reviewed the following:   Labs:  Sodium 128 chloride 98 total protein 5.6 total bili 1.9  Imaging studies:  nad  Medical tests:  nad   Test discussed with performing physician:  nad  Decision to obtain old  records:  nad  Review and summation of old records:  reviewed  Scheduled Meds: . heparin  5,000 Units Subcutaneous Q8H   Continuous Infusions: . lactated ringers 10 mL/hr at 10/05/17 1517    Active Problems:   Hip fracture (HCC)   Displaced fracture of left femoral neck (HCC)   LOS: 1 day

## 2017-10-07 LAB — BASIC METABOLIC PANEL
Anion gap: 6 (ref 5–15)
BUN: <5 mg/dL — ABNORMAL LOW (ref 8–23)
CO2: 26 mmol/L (ref 22–32)
Calcium: 8.8 mg/dL — ABNORMAL LOW (ref 8.9–10.3)
Chloride: 94 mmol/L — ABNORMAL LOW (ref 98–111)
Creatinine, Ser: 0.42 mg/dL — ABNORMAL LOW (ref 0.44–1.00)
GFR calc Af Amer: >60 mL/min (ref >60)
GFR calc non Af Amer: >60 mL/min (ref >60)
Glucose, Bld: 113 mg/dL — ABNORMAL HIGH (ref 70–99)
Potassium: 3.6 mmol/L (ref 3.5–5.1)
Sodium: 126 mmol/L — ABNORMAL LOW (ref 135–145)

## 2017-10-07 LAB — CBC
HCT: 25.8 % — ABNORMAL LOW (ref 36.0–46.0)
Hemoglobin: 8.7 g/dL — ABNORMAL LOW (ref 12.0–15.0)
MCH: 28.8 pg (ref 26.0–34.0)
MCHC: 33.7 g/dL (ref 30.0–36.0)
MCV: 85.4 fL (ref 78.0–100.0)
Platelets: 123 10*3/uL — ABNORMAL LOW (ref 150–400)
RBC: 3.02 MIL/uL — ABNORMAL LOW (ref 3.87–5.11)
RDW: 13.3 % (ref 11.5–15.5)
WBC: 5.2 10*3/uL (ref 4.0–10.5)

## 2017-10-07 LAB — MAGNESIUM: Magnesium: 1.7 mg/dL (ref 1.7–2.4)

## 2017-10-07 LAB — SODIUM, URINE, RANDOM: Sodium, Ur: <10 mmol/L

## 2017-10-07 LAB — CREATININE, URINE, RANDOM: Creatinine, Urine: 143.77 mg/dL

## 2017-10-07 MED ORDER — SODIUM CHLORIDE 0.9 % IV SOLN
INTRAVENOUS | Status: DC
  Administered 2017-10-07: 13:00:00 via INTRAVENOUS

## 2017-10-07 MED ORDER — IBUPROFEN 200 MG PO TABS
400.0000 mg | ORAL_TABLET | Freq: Four times a day (QID) | ORAL | Status: DC
  Filled 2017-10-07: qty 2

## 2017-10-07 MED ORDER — ZOLPIDEM TARTRATE 5 MG PO TABS: 5.0000 mg | ORAL_TABLET | Freq: Every evening | ORAL | Status: DC | PRN

## 2017-10-07 NOTE — Plan of Care (Signed)

## 2017-10-07 NOTE — Progress Notes (Signed)
Physical Therapy Treatment Patient Details Name: Christina Chase MRN: [id number] DOB: 1945/05/22 Today's Date: 10/07/2017    History of Present Illness Pt is a 72 y.o. female with medical history significant of sensitivity to medications who presented to the ED after a fall at home. Xray revealed L hip fx. She underwent L THA 10-05-17.      PT Comments    Patient progressing slowly with physical therapy goals. Able to ambulate 40 feet with walker and min guard assist with increased encouragement. Continues with LLE weakness, pain, and decreased range of motion. Rest of session focused on isometric bed level exercises for hip and knee strengthening. Will benefit from stair and family training prior to discharge home.     Follow Up Recommendations  Home health PT;Supervision/Assistance - 24 hour     Equipment Recommendations  Rolling walker with 5" wheels;3in1 (PT)    Recommendations for Other Services       Precautions / Restrictions Precautions Precautions: Fall Restrictions Weight Bearing Restrictions: Yes LLE Weight Bearing: Weight bearing as tolerated    Mobility  Bed Mobility Overal bed mobility: Needs Assistance Bed Mobility: Sit to Supine     Supine to sit: HOB elevated;Min assist     General bed mobility comments: Min A for managing LLE.   Transfers Overall transfer level: Needs assistance Equipment used: Rolling walker (2 wheeled) Transfers: Sit to/from Stand Sit to Stand: Min guard         General transfer comment: cues for hand placement. increased time and effort for transitioning from recliner and toilet.  Ambulation/Gait Ambulation/Gait assistance: Min guard Gait Distance (Feet): 40 Feet Assistive device: Rolling walker (2 wheeled) Gait Pattern/deviations: Step-to pattern;Decreased stride length;Decreased weight shift to left;Decreased step length - right;Antalgic     General Gait Details: Patient took ~15 minutes to walk 40 feet with several  standing rest breaks required and increased fatigue due to "burning" in arms from pushing down on walker. Cues for increased LLE weightbearing, pointing left foot straight forward to promote increased knee flexion, and sequencing. Patient stated she felt like she was going to "pass out from fatigue," during last 5 feet but however was still able to maintain conversation with family    Stairs             Wheelchair Mobility    Modified Rankin (Stroke Patients Only)       Balance Overall balance assessment: Needs assistance Sitting-balance support: No upper extremity supported;Feet supported Sitting balance-Leahy Scale: Fair     Standing balance support: Bilateral upper extremity supported;During functional activity Standing balance-Leahy Scale: Poor                              Cognition Arousal/Alertness: Awake/alert Behavior During Therapy: WFL for tasks assessed/performed;Anxious Overall Cognitive Status: Within Functional Limits for tasks assessed                                        Exercises Total Joint Exercises Quad Sets: Left;Supine;15 reps Gluteal Sets: 15 reps;Supine Hip ABduction/ADduction: Left;15 reps;Supine    General Comments        Pertinent Vitals/Pain Pain Assessment: Faces Faces Pain Scale: Hurts even more Pain Location: L hip during mobility, arms "burning," with pushing down through walker Pain Descriptors / Indicators: Moaning;Grimacing;Guarding Pain Intervention(s): Monitored during session    Home Living  Prior Function            PT Goals (current goals can now be found in the care plan section) Acute Rehab PT Goals Patient Stated Goal: "I want to feel better" Potential to Achieve Goals: Good Progress towards PT goals: Progressing toward goals    Frequency    7X/week      PT Plan Frequency needs to be updated    Co-evaluation              AM-PAC PT "6  Clicks" Daily Activity  Outcome Measure  Difficulty turning over in bed (including adjusting bedclothes, sheets and blankets)?: A Lot Difficulty moving from lying on back to sitting on the side of the bed? : Unable Difficulty sitting down on and standing up from a chair with arms (e.g., wheelchair, bedside commode, etc,.)?: A Little Help needed moving to and from a bed to chair (including a wheelchair)?: A Little Help needed walking in hospital room?: A Little Help needed climbing 3-5 steps with a railing? : A Lot 6 Click Score: 14    End of Session   Activity Tolerance: Patient tolerated treatment well Patient left: in bed;with call bell/phone within reach;with family/visitor present Nurse Communication: Mobility status PT Visit Diagnosis: Other abnormalities of gait and mobility (R26.89);Pain;Difficulty in walking, not elsewhere classified (R26.2) Pain - Right/Left: Left Pain - part of body: Hip     Time: [id number] PT Time Calculation (min) (ACUTE ONLY): 42 min  Charges:  $Gait Training: 8-22 mins $Therapeutic Exercise: 8-22 mins $Therapeutic Activity: 8-22 mins                    Laurina Bustlearoline Hyun Reali, PT, DPT Acute Rehabilitation Services  Pager: [phone]    [name] 10/07/2017, 5:07 PM

## 2017-10-07 NOTE — Evaluation (Signed)
Occupational Therapy Evaluation Patient Details Name: Christina Chase MRN: [id number] DOB: [birth year] Today's Date: 10/07/2017    History of Present Illness Pt is a 72 y.o. female with medical history significant of sensitivity to medications who presented to the ED after a fall at home. Xray revealed L hip fx. She underwent L THA 10-05-17.     Clinical Impression   PTA, pt was living with her husband and was independent. Currently, pt requires Min-Mod A for  LB ADLs and Min A for functional mobility using RW. Pt presented with decreased strength, balance, and activity tolerance as well as being limited by pain at LLE. Pt requiring increased encouragement to optimize occupational performance and participation. Pt would benefit from further acute OT to facilitate safe dc. Recommend dc to home with HHOT for further OT to optimize safety, independence with ADLs, and return to PLOF.      Follow Up Recommendations  Home health OT;Supervision/Assistance - 24 hour    Equipment Recommendations  3 in 1 bedside commode    Recommendations for Other Services PT consult     Precautions / Restrictions Precautions Precautions: Fall Restrictions Weight Bearing Restrictions: Yes LLE Weight Bearing: Weight bearing as tolerated      Mobility Bed Mobility Overal bed mobility: Needs Assistance Bed Mobility: Supine to Sit     Supine to sit: HOB elevated;Min assist     General bed mobility comments: Min A for managing LLE. Pt able to bring hips towards EOB  Transfers Overall transfer level: Needs assistance Equipment used: Rolling walker (2 wheeled) Transfers: Sit to/from Stand Sit to Stand: Min assist         General transfer comment: cues for hand placement, assist to power up, increased time and effort, increased time to stabilize initial standing balance    Balance Overall balance assessment: Needs assistance Sitting-balance support: No upper extremity supported;Feet  supported Sitting balance-Leahy Scale: Fair     Standing balance support: Bilateral upper extremity supported;During functional activity Standing balance-Leahy Scale: Poor                             ADL either performed or assessed with clinical judgement   ADL Overall ADL's : Needs assistance/impaired Eating/Feeding: Independent   Grooming: Brushing hair;Set up;Sitting   Upper Body Bathing: Set up;Supervision/ safety;Sitting   Lower Body Bathing: Minimal assistance;Sit to/from stand Lower Body Bathing Details (indicate cue type and reason): Min A for reaching LLE and for standing balance Upper Body Dressing : Set up;Supervision/safety;Sitting   Lower Body Dressing: Moderate assistance;Sit to/from stand Lower Body Dressing Details (indicate cue type and reason): Pt able to bring RLE towards EOB to adjust socks. Unable to bend forward to adjust left sock. Min A for standing balance Toilet Transfer: Minimal assistance;Ambulation;BSC;RW Toilet Transfer Details (indicate cue type and reason): Pt requiring Max VCs due to anxiety with mobility. Pt requiring Min A for balance and safety         Functional mobility during ADLs: Minimal assistance;Rolling walker General ADL Comments: Pt presenting with decreased balance and is limited by pain. Pt axious about functional performance saying "I don't know if I will walk the same again." Use activity listening and encouragment to facilitate positive thinking and optimize motivation for participation in therapy     Vision         Perception     Praxis      Pertinent Vitals/Pain Pain Assessment: Faces Faces Pain Scale: Hurts  even more Pain Location: L hip during mobility Pain Descriptors / Indicators: Moaning;Grimacing;Guarding Pain Intervention(s): Monitored during session;Limited activity within patient's tolerance;Repositioned;Ice applied     Hand Dominance Right   Extremity/Trunk Assessment Upper Extremity  Assessment Upper Extremity Assessment: Overall WFL for tasks assessed   Lower Extremity Assessment Lower Extremity Assessment: Defer to PT evaluation;LLE deficits/detail LLE Deficits / Details: s/p THA LLE: Unable to fully assess due to pain   Cervical / Trunk Assessment Cervical / Trunk Assessment: Normal   Communication Communication Communication: No difficulties   Cognition Arousal/Alertness: Awake/alert Behavior During Therapy: WFL for tasks assessed/performed;Anxious Overall Cognitive Status: Within Functional Limits for tasks assessed                                     General Comments  Educating pt on imporance of getting OOB for toileting    Exercises     Shoulder Instructions      Home Living Family/patient expects to be discharged to:: Private residence Living Arrangements: Spouse/significant other Available Help at Discharge: Family;Available 24 hours/day Type of Home: House Home Access: Stairs to enter [employer] of Steps: 3 (2 4-inch steps, then 1 step over threshold) Entrance Stairs-Rails: None Home Layout: One level     Bathroom Shower/Tub: Tub/shower unit;Walk-in shower   Bathroom Toilet: Standard Bathroom Accessibility: No   Home Equipment: None   Additional Comments: very small bathrooms and doorways. walk-in shower and bathtub too small to accomedate seat. BR only accessible with RW by side stepping.      Prior Functioning/Environment Level of Independence: Independent                 OT Problem List: Decreased range of motion;Decreased activity tolerance;Impaired balance (sitting and/or standing);Decreased knowledge of use of DME or AE;Decreased knowledge of precautions;Pain      OT Treatment/Interventions: Self-care/ADL training;Therapeutic exercise;Energy conservation;DME and/or AE instruction;Therapeutic activities;Patient/family education;Balance training    OT Goals(Current goals can be found in the  care plan section) Acute Rehab OT Goals Patient Stated Goal: "I want to feel better" OT Goal Formulation: With patient Time For Goal Achievement: 10/21/17 Potential to Achieve Goals: Good  OT Frequency: Min 3X/week   Barriers to D/C:            Co-evaluation              AM-PAC PT "6 Clicks" Daily Activity     Outcome Measure Help from another person eating meals?: None Help from another person taking care of personal grooming?: None Help from another person toileting, which includes using toliet, bedpan, or urinal?: A Little Help from another person bathing (including washing, rinsing, drying)?: A Little Help from another person to put on and taking off regular upper body clothing?: A Little Help from another person to put on and taking off regular lower body clothing?: A Lot 6 Click Score: 19   End of Session Equipment Utilized During Treatment: Gait belt;Rolling walker Nurse Communication: Mobility status;Weight bearing status  Activity Tolerance: Patient tolerated treatment well;Patient limited by pain Patient left: in chair;with call bell/phone within reach  OT Visit Diagnosis: Unsteadiness on feet (R26.81);Other abnormalities of gait and mobility (R26.89);Muscle weakness (generalized) (M62.81);Pain;History of falling (Z91.81) Pain - Right/Left: Left Pain - part of body: Leg;Hip                Time: 6962-9528 OT Time Calculation (min): 19 min Charges:  OT General  Charges $OT Visit: 1 Visit OT Evaluation $OT Eval Moderate Complexity: 1 Mod  Christina Chase MSOT, OTR/L Acute Rehab Pager: [phone] Office: [phone]  Christina Chase 10/07/2017, 9:20 AM

## 2017-10-07 NOTE — Progress Notes (Signed)
[facility] PROGRESS NOTE  Christina Jeffersonatricia Nicolaisen GNF:[id number] DOB: [birth year] DOA: 10/05/2017 PCP: Donita BrooksPickard, Warren T, MD   Narrative: [name]'s past medical history, fall and hip fracture-S[facility]ck MD consulted for orthopedics-had surgery 8/2   A & Plan Hip fracture-Defer to orthopedics planning weightbearing pain control etc. Hypovolemic hyponatremia plus mild hypercalcemia + mild hypercalcemia --sodium is low in the 126 range-continue NS 50 cc/H give a one-time dose of p.o. Lasix 20 and reassess--obtaining urine sodium, urine creatinine prior to Lasix Hypocalcemia-May need supplementation-monitor Impaired glucose tolerance-sugars 118-124-A1c would correspond to less than 5.2 Mild hyperlipidemia LDL last year 127 total cholesterol 218-outpatient management Mild hyperbilirubinemia-monitor in a.m. 8/5 Dilutional anemia hemoglobin has dropped over the course of time from 18-10 to now 8.7 with a mild thrombocytopenia of 123-I would watch this closely and recheck in the morning.  With her hypocalcemia I wonder if there is any other cause for this   DVT prophylaxis: Defer to orthopedics code Status: Full code family Communication: None disposition Plan: Probable skilled placement needed after therapy.   Mahala MenghiniSamtani, MD  [facility] Direct contact: [phone] --Via amion app OR  --[email or web address]; password [email or web address]  7PM-7AM contact night coverage as above 10/07/2017, 11:11 AM  LOS: 2 days   Consultants:  Swinteck MD  Procedures:  Left total hip arthroplasty anterior approach 8/2  Antimicrobials:  Perioperative  Interval history/Subjective: Awake Seems to have shoulder and leg pains Also very concerned about taking Aspirin for DVt prophy  Objective:  Vitals:  Vitals:   10/06/17 1947 10/07/17 0404  BP: 121/69 120/68  Pulse: (!) 103 100  Resp: 16 18  Temp: 98.2 F (36.8 C) 99.2 F (37.3 C)  SpO2: 99% 99%    Exam:  . Anxious fidgety . eomi slim,  mild wasting . No pallor no ict, cta b . No added sound . abd soft nt nd*   I have personally reviewed the following:   Labs:  Sodium 128 chloride 98 total protein 5.6 total bili 1.9  Imaging studies:  nad  Medical tests:  nad   Test discussed with performing physician:  nad  Decision to obtain old records:  nad  Review and summation of old records:  reviewed  Scheduled Meds: . docusate sodium  100 mg Oral BID  . enoxaparin (LOVENOX) injection  40 mg Subcutaneous Q24H  . ibuprofen  400 mg Oral QID   Continuous Infusions: . sodium chloride    . lactated ringers 10 mL/hr at 10/05/17 1517    Active Problems:   Hip fracture (HCC)   Displaced fracture of left femoral neck (HCC)   LOS: 2 days

## 2017-10-07 NOTE — Progress Notes (Signed)
   Subjective:  Patient reports pain as mild.  Doing better today.  Still not satisfied with the food.  No other complaints.  Objective:   VITALS:   Vitals:   10/06/17 0405 10/06/17 1410 10/06/17 1947 10/07/17 0404  BP: 103/62 133/71 121/69 120/68  Pulse: 98 (!) 102 (!) 103 100  Resp: 14 17 16 18   Temp: 98.5 F (36.9 C) 98.3 F (36.8 C) 98.2 F (36.8 C) 99.2 F (37.3 C)  TempSrc: Oral Oral Oral Oral  SpO2: 98% 99% 99% 99%  Weight:      Height:        Neurologically intact Sensation intact distally Intact pulses distally Dorsiflexion/Plantar flexion intact Incision: dressing C/D/I   Lab Results  Component Value Date   WBC 5.2 10/07/2017   HGB 8.7 (L) 10/07/2017   HCT 25.8 (L) 10/07/2017   MCV 85.4 10/07/2017   PLT 123 (L) 10/07/2017   BMET    Component Value Date/Time   NA 126 (L) 10/07/2017 0610   K 3.6 10/07/2017 0610   CL 94 (L) 10/07/2017 0610   CO2 26 10/07/2017 0610   GLUCOSE 113 (H) 10/07/2017 0610   BUN <5 (L) 10/07/2017 0610   CREATININE 0.42 (L) 10/07/2017 0610   CREATININE 0.61 07/07/2016 1039   CALCIUM 8.8 (L) 10/07/2017 0610   GFRNONAA >60 10/07/2017 0610   GFRNONAA >89 07/07/2016 1039   GFRAA >60 10/07/2017 0610   GFRAA >89 07/07/2016 1039     Assessment/Plan: 2 Days Post-Op   Active Problems:   Hip fracture (HCC)   Displaced fracture of left femoral neck (HCC)   Advance diet Up with therapy -Okay for weightbearing as tolerated to the left lower extremity with walker. -Maintain postoperative bandage until follow-up appointment. -Lovenox for DVT prophylaxis with SCDs while in the hospital.  She will discharge home on twice daily baby aspirin for 6 weeks.   Yolonda KidaJason Patrick Nakiya Rallis 10/07/2017, 9:00 AM   Maryan RuedJason P Adonay Scheier, MD [phone]

## 2017-10-08 LAB — CBC WITH DIFFERENTIAL/PLATELET
ABS IMMATURE GRANULOCYTES: 0 10*3/uL (ref 0.0–0.1)
BASOS ABS: 0 10*3/uL (ref 0.0–0.1)
BASOS PCT: 0 %
Eosinophils Absolute: 0 10*3/uL (ref 0.0–0.7)
Eosinophils Relative: 1 %
HCT: 25 % — ABNORMAL LOW (ref 36.0–46.0)
Hemoglobin: 8.6 g/dL — ABNORMAL LOW (ref 12.0–15.0)
IMMATURE GRANULOCYTES: 0 %
Lymphocytes Relative: 23 %
Lymphs Abs: 1 10*3/uL (ref 0.7–4.0)
MCH: 29.6 pg (ref 26.0–34.0)
MCHC: 34.4 g/dL (ref 30.0–36.0)
MCV: 85.9 fL (ref 78.0–100.0)
Monocytes Absolute: 0.6 10*3/uL (ref 0.1–1.0)
Monocytes Relative: 14 %
NEUTROS PCT: 62 %
Neutro Abs: 2.7 10*3/uL (ref 1.7–7.7)
Platelets: 129 10*3/uL — ABNORMAL LOW (ref 150–400)
RBC: 2.91 MIL/uL — ABNORMAL LOW (ref 3.87–5.11)
RDW: 13.5 % (ref 11.5–15.5)
WBC: 4.4 10*3/uL (ref 4.0–10.5)

## 2017-10-08 LAB — PTH, INTACT AND CALCIUM
Calcium, Total (PTH): 8.7 mg/dL (ref 8.7–10.3)
PTH: 40 pg/mL (ref 15–65)

## 2017-10-08 LAB — PREPARE RBC (CROSSMATCH)

## 2017-10-08 LAB — COMPREHENSIVE METABOLIC PANEL
ALT: 21 U/L (ref 0–44)
AST: 40 U/L (ref 15–41)
Albumin: 3.2 g/dL — ABNORMAL LOW (ref 3.5–5.0)
Alkaline Phosphatase: 38 U/L (ref 38–126)
Anion gap: 7 (ref 5–15)
BILIRUBIN TOTAL: 1.3 mg/dL — AB (ref 0.3–1.2)
CALCIUM: 9.4 mg/dL (ref 8.9–10.3)
CHLORIDE: 100 mmol/L (ref 98–111)
CO2: 27 mmol/L (ref 22–32)
CREATININE: 0.48 mg/dL (ref 0.44–1.00)
Glucose, Bld: 112 mg/dL — ABNORMAL HIGH (ref 70–99)
Potassium: 3.7 mmol/L (ref 3.5–5.1)
Sodium: 134 mmol/L — ABNORMAL LOW (ref 135–145)
TOTAL PROTEIN: 5.4 g/dL — AB (ref 6.5–8.1)

## 2017-10-08 LAB — HEMOGLOBIN AND HEMATOCRIT, BLOOD
HCT: 27.7 % — ABNORMAL LOW (ref 36.0–46.0)
Hemoglobin: 9.3 g/dL — ABNORMAL LOW (ref 12.0–15.0)

## 2017-10-08 MED ORDER — SODIUM CHLORIDE 0.9% IV SOLUTION
Freq: Once | INTRAVENOUS | Status: AC
  Administered 2017-10-08: 16:00:00 via INTRAVENOUS

## 2017-10-08 MED ORDER — DIPHENHYDRAMINE HCL 25 MG PO CAPS
25.0000 mg | ORAL_CAPSULE | Freq: Once | ORAL | Status: AC
  Administered 2017-10-08: 25 mg via ORAL
  Filled 2017-10-08: qty 1

## 2017-10-08 MED ORDER — ASPIRIN 81 MG PO TABS: 81.0000 mg | ORAL_TABLET | Freq: Two times a day (BID) | ORAL | 1 refills | Status: DC

## 2017-10-08 MED ORDER — HYDROCODONE-ACETAMINOPHEN 5-325 MG PO TABS: 1.0000 | ORAL_TABLET | Freq: Four times a day (QID) | ORAL | 0 refills | Status: DC | PRN

## 2017-10-08 MED ORDER — FUROSEMIDE 10 MG/ML IJ SOLN
20.0000 mg | Freq: Once | INTRAMUSCULAR | Status: DC
  Filled 2017-10-08: qty 2

## 2017-10-08 MED ORDER — FUROSEMIDE 10 MG/ML IJ SOLN
20.0000 mg | Freq: Once | INTRAMUSCULAR | Status: AC
  Administered 2017-10-09: 20 mg via INTRAVENOUS
  Filled 2017-10-08: qty 2

## 2017-10-08 NOTE — Progress Notes (Signed)
    Subjective:  Patient reports pain as mild to moderate.  Denies N/V/CP/SOB. Orthostatic with PT.  Objective:   VITALS:   Vitals:   10/07/17 0404 10/07/17 1333 10/07/17 1942 10/08/17 0344  BP: 120/68 112/68 117/68 111/62  Pulse: 100 96 99 90  Resp: 18     Temp: 99.2 F (37.3 C) 98.3 F (36.8 C) 98.5 F (36.9 C) 98.2 F (36.8 C)  TempSrc: Oral Oral Oral Oral  SpO2: 99% 100% 98% 98%  Weight:      Height:        NAD ABD soft Sensation intact distally Intact pulses distally Dorsiflexion/Plantar flexion intact Incision: dressing C/D/I Compartment soft   Lab Results  Component Value Date   WBC 4.4 10/08/2017   HGB 8.6 (L) 10/08/2017   HCT 25.0 (L) 10/08/2017   MCV 85.9 10/08/2017   PLT 129 (L) 10/08/2017   BMET    Component Value Date/Time   NA 134 (L) 10/08/2017 0426   K 3.7 10/08/2017 0426   CL 100 10/08/2017 0426   CO2 27 10/08/2017 0426   GLUCOSE 112 (H) 10/08/2017 0426   BUN <5 (L) 10/08/2017 0426   CREATININE 0.48 10/08/2017 0426   CREATININE 0.61 07/07/2016 1039   CALCIUM 9.4 10/08/2017 0426   GFRNONAA >60 10/08/2017 0426   GFRNONAA >89 07/07/2016 1039   GFRAA >60 10/08/2017 0426   GFRAA >89 07/07/2016 1039     Assessment/Plan: 3 Days Post-Op   Active Problems:   Closed left hip fracture, initial encounter (HCC)   Displaced fracture of left femoral neck (HCC)   WBAT with walker DVT ppx: lovenox in house --> home on ASA, SCDs, TEDS PO pain control PT/OT ABLA per hospitalist Dispo: D/C home with HHPT when ok with hospitalist   Iline OvenBrian J Basilia Stuckert 10/08/2017, 12:42 PM   Samson FredericBrian Landynn Dupler, MD Cell [phone]

## 2017-10-08 NOTE — Progress Notes (Signed)
Occupational Therapy Treatment Patient Details Name: Christina Chase MRN: [id number] DOB: 1945/07/25 Today's Date: 10/08/2017    History of present illness Pt is a 72 y.o. female with medical history significant of sensitivity to medications who presented to the ED after a fall at home. Xray revealed L hip fx. She underwent L THA 10-05-17.     OT comments  This 72 yo female seen today to address basic ADLs due to pt already set up by NT for bathing at EOB. Pt did not want to attempt to stand again (since earlier she got symptomatic of dizziness when she stood with PT), but able to work on completing ADL sitting lateral leans at EOB.   Follow Up Recommendations  Home health OT;Supervision/Assistance - 24 hour    Equipment Recommendations  3 in 1 bedside commode       Precautions / Restrictions Precautions Precautions: Fall Precaution Comments: 10/08/2017 dizziness with standing (to receive 1 unit of blood) Restrictions Weight Bearing Restrictions: No LLE Weight Bearing: Weight bearing as tolerated       Mobility Bed Mobility Overal bed mobility: Needs Assistance Bed Mobility: Sit to Supine     Sit to supine: Min assist   General bed mobility comments: pt sitting on EOB upon my arrival, Min A for LLE management back into bed  Transfers Overall transfer level: Needs assistance        Lateral/Scoot Transfers: Supervision General transfer comment: Pt able to scoot up towards EOB while seated EOB    Balance Overall balance assessment: Needs assistance Sitting-balance support: No upper extremity supported;Feet supported Sitting balance-Leahy Scale: Good                           ADL either performed or assessed with clinical judgement   ADL Overall ADL's : Needs assistance/impaired         Upper Body Bathing: Set up;Sitting   Lower Body Bathing: Minimal assistance;Sitting/lateral leans Lower Body Bathing Details (indicate cue type and reason): left calf and  distally     Lower Body Dressing: Moderate assistance Lower Body Dressing Details (indicate cue type and reason): to get all items on LLE                     Vision Patient Visual Report: No change from baseline            Cognition Arousal/Alertness: Awake/alert Behavior During Therapy: WFL for tasks assessed/performed Overall Cognitive Status: Within Functional Limits for tasks assessed                                 General Comments: perseverating on blood draw from this AM and getting blood later                   Pertinent Vitals/ Pain       Pain Assessment: 0-10 Pain Score: 5  Pain Location: L hip surgical site  Pain Descriptors / Indicators: Aching;Discomfort;Sore Pain Intervention(s): Limited activity within patient's tolerance;Monitored during session;Repositioned     Prior Functioning/Environment              Frequency  Min 3X/week        Progress Toward Goals  OT Goals(current goals can now be found in the care plan section)  Progress towards OT goals: Not progressing toward goals - comment(due to dizziness upon standing)  Acute Rehab OT  Goals Patient Stated Goal: "I want to feel better" ADL Goals Pt Will Perform Grooming: with supervision;with set-up;standing Pt Will Transfer to Toilet: with supervision;ambulating;bedside commode Pt Will Perform Toileting - Clothing Manipulation and hygiene: with supervision;sit to/from stand Additional ADL Goal #1: Pt will S in and OOB for basic ADLs  Plan Discharge plan remains appropriate       AM-PAC PT "6 Clicks" Daily Activity     Outcome Measure   Help from another person eating meals?: None Help from another person taking care of personal grooming?: A Little Help from another person toileting, which includes using toliet, bedpan, or urinal?: A Little Help from another person bathing (including washing, rinsing, drying)?: A Little Help from another person to put on and  taking off regular upper body clothing?: A Little Help from another person to put on and taking off regular lower body clothing?: A Lot 6 Click Score: 18    End of Session    OT Visit Diagnosis: Unsteadiness on feet (R26.81);Other abnormalities of gait and mobility (R26.89);Muscle weakness (generalized) (M62.81);Pain;History of falling (Z91.81) Pain - Right/Left: Left Pain - part of body: Hip   Activity Tolerance (limited by dizziness)   Patient Left in bed;with call bell/phone within reach;with bed alarm set           Time: 1123-1200 OT Time Calculation (min): 37 min  Charges: OT General Charges $OT Visit: 1 Visit OT Treatments $Self Care/Home Management : 23-37 mins Ignacia PalmaCathy Ranisha Allaire, OTR/L [phone] 10/08/2017

## 2017-10-08 NOTE — Progress Notes (Signed)
OT Cancellation Note  Patient Details Name: Vernice Jeffersonatricia Novitsky MRN: [id number] DOB: [birth year]   Cancelled Treatment:    Reason Eval/Treat Not Completed: Other (comment). Pt with significant drop in BP with PT earlier and symptomatic. Will hold treatment for now.  Evette GeorgesLeonard, Kynesha Guerin Eva [phone] 10/08/2017, 10:53 AM

## 2017-10-08 NOTE — Progress Notes (Signed)
Pt refused Lasix post blood transfusion. RN provided education. Pt continued to refuse. Will continue to monitor.

## 2017-10-08 NOTE — Progress Notes (Signed)
Physical Therapy Treatment Patient Details Name: Christina Chase MRN: [id number] DOB: 08-04-1945 Today's Date: 10/08/2017    History of Present Illness Pt is a 72 y.o. female with medical history significant of sensitivity to medications who presented to the ED after a fall at home. Xray revealed L hip fx. She underwent L THA 10-05-17.      PT Comments    Patient received in bed, pleasant but reporting general malaise this morning. Took orthostatic measurements per RN request with assist levels as indicated below in note and BP measures as follows:  Supine 107/69 HR 102 Sitting 96/62  HR 105 Standing 71/37 MAP 47 HR 118; patient extremely symptomatic After return to sitting for 2 minutes 95/69 MAP 78  Unable to progress mobility today due to symptomatic presentation and safety concerns as patient appeared near syncope just standing at EOB while taking BP. RN was educated regarding measurements taken today and safety concerns with mobility with PT due to symptomatic presentation. Patient was left in bed with family present, all needs otherwise met this morning.     Follow Up Recommendations  Home health PT;Supervision/Assistance - 24 hour     Equipment Recommendations  Rolling walker with 5" wheels;3in1 (PT)    Recommendations for Other Services       Precautions / Restrictions Precautions Precautions: Fall Restrictions Weight Bearing Restrictions: Yes LLE Weight Bearing: Weight bearing as tolerated    Mobility  Bed Mobility Overal bed mobility: Needs Assistance Bed Mobility: Supine to Sit;Sit to Supine     Supine to sit: HOB elevated;Min guard Sit to supine: Min assist   General bed mobility comments: MInA for LE management   Transfers Overall transfer level: Needs assistance Equipment used: Rolling walker (2 wheeled) Transfers: Sit to/from Stand Sit to Stand: Min guard         General transfer comment: extended time and effort   Ambulation/Gait              General Gait Details: DNT due to vitals response, BP 71/37 and MAP 47, patient very symptomatic and near syncope in time it took just to get BP measure    Stairs             Wheelchair Mobility    Modified Rankin (Stroke Patients Only)       Balance Overall balance assessment: Needs assistance Sitting-balance support: No upper extremity supported;Feet supported Sitting balance-Leahy Scale: Fair     Standing balance support: Bilateral upper extremity supported;During functional activity Standing balance-Leahy Scale: Poor                              Cognition Arousal/Alertness: Awake/alert Behavior During Therapy: WFL for tasks assessed/performed Overall Cognitive Status: Within Functional Limits for tasks assessed                                 General Comments: perseverating on blood draw from this AM       Exercises      General Comments        Pertinent Vitals/Pain Pain Assessment: 0-10 Pain Score: 5  Pain Location: L hip surgical site  Pain Descriptors / Indicators: Aching;Discomfort;Sore Pain Intervention(s): Limited activity within patient's tolerance;Monitored during session    Home Living                      Prior Function  PT Goals (current goals can now be found in the care plan section) Acute Rehab PT Goals Patient Stated Goal: "I want to feel better" PT Goal Formulation: With patient/family Time For Goal Achievement: 10/20/17 Potential to Achieve Goals: Good Progress towards PT goals: Progressing toward goals    Frequency    7X/week      PT Plan Current plan remains appropriate    Co-evaluation              AM-PAC PT "6 Clicks" Daily Activity  Outcome Measure  Difficulty turning over in bed (including adjusting bedclothes, sheets and blankets)?: A Lot Difficulty moving from lying on back to sitting on the side of the bed? : A Lot Difficulty sitting down on and  standing up from a chair with arms (e.g., wheelchair, bedside commode, etc,.)?: A Little Help needed moving to and from a bed to chair (including a wheelchair)?: A Little Help needed walking in hospital room?: A Little Help needed climbing 3-5 steps with a railing? : A Lot 6 Click Score: 15    End of Session Equipment Utilized During Treatment: Gait belt Activity Tolerance: Treatment limited secondary to medical complications (Comment) Patient left: in bed;with call bell/phone within reach;with family/visitor present Nurse Communication: Mobility status;Other (comment)(vitals response, safety concerns with PT this morning ) PT Visit Diagnosis: Other abnormalities of gait and mobility (R26.89);Pain;Difficulty in walking, not elsewhere classified (R26.2) Pain - Right/Left: Left Pain - part of body: Hip     Time: 2751-7001 PT Time Calculation (min) (ACUTE ONLY): 25 min  Charges:  $Therapeutic Activity: 8-22 mins $Self Care/Home Management: 8-22                     Christina Chase PT, DPT, CBIS  Supplemental Physical Therapist Christina Chase   Pager [phone]

## 2017-10-08 NOTE — Progress Notes (Signed)
Physical Therapy Treatment Patient Details Name: Christina Chase MRN: [id number] DOB: 06-Dec-1945 Today's Date: 10/08/2017    History of Present Illness Pt is a 72 y.o. female with medical history significant of sensitivity to medications who presented to the ED after a fall at home. Xray revealed L hip fx. She underwent L THA 10-05-17.      PT Comments    Patient received in bed this afternoon, requesting urgent assistance to toilet. Able to perform supine to sit with S, stand-pivot transfer to bedside commode with min guard and RW both to and from commode today. After return to sitting position at EOB, took BP measures again with significant drop noted again (see vitals flowsheet for details). Returned patient to bed as she is still unsafe to progress mobility due to extent of BP drop today. RN aware of BP measures and patient status. She was left in bed with all needs met, visitors present, alarm activated this afternoon.    Follow Up Recommendations  Home health PT;Supervision/Assistance - 24 hour     Equipment Recommendations  Rolling walker with 5" wheels;3in1 (PT)    Recommendations for Other Services       Precautions / Restrictions Precautions Precautions: Fall Precaution Comments: 10/08/2017 dizziness with standing (to receive 1 unit of blood); watch orthostatic vitals  Restrictions Weight Bearing Restrictions: No LLE Weight Bearing: Weight bearing as tolerated    Mobility  Bed Mobility Overal bed mobility: Needs Assistance Bed Mobility: Supine to Sit;Sit to Supine     Supine to sit: HOB elevated;Min guard Sit to supine: Min assist   General bed mobility comments: MinA for LE management back into bed   Transfers Overall transfer level: Needs assistance Equipment used: Rolling walker (2 wheeled) Transfers: Sit to/from [facility] Sit to Stand: Min guard        Lateral/Scoot Transfers: Min guard General transfer comment: Min guard for safety due to  dizziness   Ambulation/Gait             General Gait Details: DNT due to orthostatic vitals today    Stairs             Wheelchair Mobility    Modified Rankin (Stroke Patients Only)       Balance Overall balance assessment: Needs assistance Sitting-balance support: No upper extremity supported;Feet supported Sitting balance-Leahy Scale: Good     Standing balance support: Bilateral upper extremity supported;During functional activity Standing balance-Leahy Scale: Poor                              Cognition Arousal/Alertness: Awake/alert Behavior During Therapy: WFL for tasks assessed/performed Overall Cognitive Status: Within Functional Limits for tasks assessed                                 General Comments: perseverating on blood draw from this AM and getting blood later      Exercises      General Comments        Pertinent Vitals/Pain Pain Assessment: 0-10 Pain Score: 5  Pain Location: L hip surgical site  Pain Descriptors / Indicators: Aching;Sore Pain Intervention(s): Limited activity within patient's tolerance;Monitored during session;Ice applied;Repositioned    Home Living                      Prior Function  PT Goals (current goals can now be found in the care plan section) Acute Rehab PT Goals Patient Stated Goal: "I want to feel better" PT Goal Formulation: With patient/family Time For Goal Achievement: 10/20/17 Potential to Achieve Goals: Good Progress towards PT goals: Not progressing toward goals - comment(limited by medical status today )    Frequency    7X/week      PT Plan Current plan remains appropriate    Co-evaluation              AM-PAC PT "6 Clicks" Daily Activity  Outcome Measure  Difficulty turning over in bed (including adjusting bedclothes, sheets and blankets)?: A Little Difficulty moving from lying on back to sitting on the side of the bed? : A  Little Difficulty sitting down on and standing up from a chair with arms (e.g., wheelchair, bedside commode, etc,.)?: A Little Help needed moving to and from a bed to chair (including a wheelchair)?: A Little Help needed walking in hospital room?: A Little Help needed climbing 3-5 steps with a railing? : A Lot 6 Click Score: 17    End of Session Equipment Utilized During Treatment: Gait belt Activity Tolerance: Treatment limited secondary to medical complications (Comment)(orthostatic vitals ) Patient left: in bed;with bed alarm set;with call bell/phone within reach;with family/visitor present Nurse Communication: Mobility status;Other (comment)(vitals this afternoon ) PT Visit Diagnosis: Other abnormalities of gait and mobility (R26.89);Pain;Difficulty in walking, not elsewhere classified (R26.2) Pain - Right/Left: Left Pain - part of body: Hip     Time: 1355-1410 PT Time Calculation (min) (ACUTE ONLY): 15 min  Charges:  $Therapeutic Activity: 8-22 mins $Self Care/Home Management: 8-22                    Deniece Ree PT, DPT, CBIS  Supplemental Physical Therapist [place]   Pager [phone]

## 2017-10-08 NOTE — Progress Notes (Addendum)
[facility] PROGRESS NOTE  Christina Jeffersonatricia Seifried ZOX:[id number] DOB: 08/29/1945 DOA: 10/05/2017 PCP: Donita BrooksPickard, Warren T, MD   Narrative: [name]'s past medical history, fall and hip fracture-[place] MD consulted for orthopedics-had surgery 8/2   A & Plan Hip fracture-Defer to orthopedics planning weightbearing pain control etc. Hypovolemic hyponatremia plus mild hypercalcemia + mild hypercalcemia --sodium resolved with lasix/fluid restriction-labs am Mild ABLA expected-transfuse 1 U if she allows as was orthostatic and woosy when ambulating with PT Hypocalcemia-May need supplementation-monitor Impaired glucose tolerance-sugars 118-124-A1c would correspond to less than 5.2 Mild hyperlipidemia LDL last year 127 total cholesterol 218-outpatient management Mild hyperbilirubinemia-monitor in a.m. 8/5  DVT prophylaxis: Defer to orthopedics code Status: Full code family Communication: None disposition Plan: Probable skilled placement needed after therapy.   Mahala MenghiniSamtani, MD  [facility] Direct contact: [phone] --Via amion app OR  --[email or web address]; password [email or web address]  7PM-7AM contact night coverage as above 10/08/2017, 1:12 PM  LOS: 3 days   Consultants:  S[place] MD  Procedures:  Left total hip arthroplasty anterior approach 8/2  Antimicrobials:  Perioperative  Interval history/Subjective:  Orthostatic + dizzy declinign blood transfusion No cp Doesn't feel good Distraught at being ill  Objective:  Vitals:  Vitals:   10/07/17 1942 10/08/17 0344  BP: 117/68 111/62  Pulse: 99 90  Resp:    Temp: 98.5 F (36.9 C) 98.2 F (36.8 C)  SpO2: 98% 98%    Exam:  . Anxious fidgety  . eomi slim, mild wasting . No pallor no ict, cta b . No added sound . abd soft nt nd . No le edema . abd soft   I have personally reviewed the following:   Labs:  Sodium 128 chloride 98 total protein 5.6 total bili 1.9  Hemoglobin down from admit  14-->8.6  Imaging studies:  nad  Medical tests:  nad   Test discussed with performing physician:  nad  Decision to obtain old records:  nad  Review and summation of old records:  reviewed  Scheduled Meds: . sodium chloride   Intravenous Once  . diphenhydrAMINE  25 mg Oral Once  . docusate sodium  100 mg Oral BID  . enoxaparin (LOVENOX) injection  40 mg Subcutaneous Q24H  . furosemide  20 mg Intravenous Once  . ibuprofen  400 mg Oral Q6H   Continuous Infusions: . lactated ringers 10 mL/hr at 10/05/17 1517    Active Problems:   Closed left hip fracture, initial encounter [facility](HCC)   Displaced fracture of left femoral neck (HCC)   LOS: 3 days

## 2017-10-08 NOTE — Plan of Care (Signed)
  Problem: Education: Goal: Knowledge of General Education information will improve Description: Including pain rating scale, medication(s)/side effects and non-pharmacologic comfort measures Outcome: Progressing   Problem: Activity: Goal: Risk for activity intolerance will decrease Outcome: Progressing   Problem: Elimination: Goal: Will not experience complications related to urinary retention Outcome: Progressing   Problem: Safety: Goal: Ability to remain free from injury will improve Outcome: Progressing   Problem: Skin Integrity: Goal: Risk for impaired skin integrity will decrease Outcome: Progressing   

## 2017-10-08 NOTE — Care Management Note (Signed)
Case Management Note  Patient Details  Name: Christina Chase MRN: [id number] Date of Birth: July 04, 1945  Subjective/Objective:   72 yr old female admitted with left hip fracture, underwent a left total hip arthroplasty.                 Action/Plan: Case manager spoke with patient concerning discharge plan and DME. Choice for Home Health Agency was offered, referral was called to Rhunette Croftarolyn Caldwell, Liaison with [facility]. Patient will have family support at discharge.  Expected Discharge Date:  (unknown)               Expected Discharge Plan:  Home w Home Health Services  In-House Referral:  NA  Discharge planning Services  CM Consult  Post Acute Care Choice:  Home Health Choice offered to:  Patient  DME Arranged:  3-N-1, Walker rolling DME Agency:  [facility]  HH Arranged:  PT, OT HH Agency:  [facility]  Status of Service:  Completed, signed off  If discussed at Long Length of Stay Meetings, dates discussed:    Additional Comments:  [name], [name], RN 10/08/2017, 12:11 PM

## 2017-10-08 NOTE — Care Management Important Message (Signed)
Important Message  Patient Details  Name: Christina Jeffersonatricia Zenker MRN: [id number] Date of Birth: 08-15-1945   Medicare Important Message Given:  Yes    Ayeza Therriault Stefan ChurchBratton 10/08/2017, 12:41 PM

## 2017-10-08 NOTE — Anesthesia Postprocedure Evaluation (Signed)
Anesthesia Post Note  Patient: Christina Jeffersonatricia Kuhlman  Procedure(s) Performed: TOTAL HIP ARTHROPLASTY ANTERIOR APPROACH (Left Hip)     Patient location during evaluation: PACU Anesthesia Type: General Level of consciousness: awake and sedated Pain management: pain level controlled Vital Signs Assessment: post-procedure vital signs reviewed and stable Respiratory status: spontaneous breathing Cardiovascular status: stable Postop Assessment: no apparent nausea or vomiting Anesthetic complications: no    Last Vitals:  Vitals:   10/07/17 1942 10/08/17 0344  BP: 117/68 111/62  Pulse: 99 90  Resp:    Temp: 36.9 C 36.8 C  SpO2: 98% 98%    Last Pain:  Vitals:   10/08/17 0344  TempSrc: Oral  PainSc:    Pain Goal:                 Darien Mignogna JR,JOHN Ruairi Stutsman

## 2017-10-09 ENCOUNTER — Encounter (HOSPITAL_COMMUNITY): Payer: Self-pay | Admitting: Orthopedic Surgery

## 2017-10-09 LAB — BPAM RBC
BLOOD PRODUCT EXPIRATION DATE: 201908112359
BLOOD PRODUCT EXPIRATION DATE: 201909062359
ISSUE DATE / TIME: 201908051537
ISSUE DATE / TIME: 201908051816
UNIT TYPE AND RH: 5100
Unit Type and Rh: 5100

## 2017-10-09 LAB — TYPE AND SCREEN
ABO/RH(D): O POS
Antibody Screen: NEGATIVE
UNIT DIVISION: 0
Unit division: 0

## 2017-10-09 LAB — PARATHYROID HORMONE, INTACT (NO CA): PTH: 76 pg/mL — ABNORMAL HIGH (ref 15–65)

## 2017-10-09 LAB — VITAMIN D 25 HYDROXY (VIT D DEFICIENCY, FRACTURES): VIT D 25 HYDROXY: 17.9 ng/mL — AB (ref 30.0–100.0)

## 2017-10-09 MED ORDER — ACETAMINOPHEN 325 MG PO TABS: 325.0000 mg | ORAL_TABLET | Freq: Four times a day (QID) | ORAL | Status: DC | PRN

## 2017-10-09 MED ORDER — IBUPROFEN 400 MG PO TABS: 400.0000 mg | ORAL_TABLET | Freq: Four times a day (QID) | ORAL | 0 refills | Status: DC

## 2017-10-09 NOTE — Plan of Care (Signed)
  Problem: Health Behavior/Discharge Planning: Goal: Ability to manage health-related needs will improve Outcome: Progressing   Problem: Clinical Measurements: Goal: Ability to maintain clinical measurements within normal limits will improve Outcome: Progressing   

## 2017-10-09 NOTE — Discharge Summary (Signed)
Physician Discharge Summary  Christina Chase ZOX:[id number] DOB: 01-11-1946 DOA: 10/05/2017  PCP: Donita Brooks, MD  Admit date: 10/05/2017 Discharge date: 10/09/2017  Time spent: 50 minutes  Recommendations for Outpatient Follow-up:  1. Needs CBC and differential as well as Chem-12 in about 1 week 2. She has mild thrombocytopenia mild hypocalcemia which may need to be worked up as an outpatient?  Malignancy versus just senescence and aging--note she also had a slightly elevated bilirubin this admission 3. I would recommend vitamin D levels as an outpatient and a DEXA scan if she is willing to perform this I would also recommend supplementation with calcium given her frailty related fracture 4. Consider outpatient A1c 5. Consider outpatient CBT referral versus psych referral as per my note  Discharge Diagnoses:  Active Problems:   Closed left hip fracture, initial encounter [facility])   Displaced fracture of left femoral neck (HCC)   Discharge Condition: Improved  Diet recommendation: What ever she wants  Filed Weights   10/05/17 1523  Weight: 52.2 kg (115 lb)    History of present illness:  [name]'s past medical history, fall and hip fracture-Swinteck MD consulted for orthopedics-had surgery 8/2   Hospital Course:   Hip fracture-Defer to orthopedics planning weightbearing pain control etc--she will need to continue aspirin twice a day at least for 4 to 6 weeks as per Dr. Linna Caprice and follow-up with him she has declined multiple opiates and other pain modalities for treatment so I have recommended Tylenol or ibuprofen.  Hypovolemic hyponatremia plus mild hypercalcemia + mild hypercalcemia --sodium resolved with lasix/fluid restriction-labs showed resolution of the same with a little bit of Lasix and fluid  Mild ABLA expected-transfuse 1 U if she allows as was orthostatic and woosy when ambulating with PT--seems to have resolved her hemoglobin is 9.3 down from 14  she will need outpatient lab checks and diet rich in iron and chooses to use vegetables more so than meat  Hypocalcemia-May need supplementation-monitor  Impaired glucose tolerance-sugars 118-124-A1c would correspond to less than 5.2  Mild hyperlipidemia LDL last year 127 total cholesterol 218-outpatient management  Mild hyperbilirubinemia-monitor in a.m. 8/5  Please screen this patient as an outpatient for either anxiety/Orthorexia--she is very specific about some of her choices in terms of pain meds diet and I believe she may benefit from some cognitive behavioral therapy     Procedures:  Surgery   Consultations:  Orthopedics Dr. Linna Caprice  Discharge Exam: Vitals:   10/08/17 2014 10/09/17 0546  BP: 138/83 119/72  Pulse: (!) 104 (!) 104  Resp: 19 16  Temp: 98.9 F (37.2 C) 98.2 F (36.8 C)  SpO2: 100% 100%    General: Alert pleasant oriented no distress Cardiovascular: S1-S2 no murmur rub or gallop Respiratory: Chest is clear Abdomen is soft nontender nondistended no rebound no guarding No lower extremity edema  Discharge Instructions   Discharge Instructions    Diet - low sodium heart healthy   Complete by:  As directed    Discharge instructions   Complete by:  As directed    Please do not drink more than 2 L a day and I would suggest only 1.8 L Get labs checked in 1 week at your primary care physician's office Would recommend continued Tylenol around-the-clock with breakthrough ibuprofen for pain-this will help you mobilize and get to your prior level of function quickly Suggest checking your vitamin D as an outpatient and supplementing that and calcium as an outpatient   Increase activity slowly  Complete by:  As directed      Allergies as of 10/09/2017      Reactions   Clindamycin/lincomycin Diarrhea   Penicillins       Medication List    TAKE these medications   acetaminophen 325 MG tablet Commonly known as:  TYLENOL Take 1-2 tablets (325-650  mg total) by mouth every 6 (six) hours as needed for mild pain (pain score 1-3 or temp > 100.5).   aspirin 81 MG tablet Take 1 tablet (81 mg total) by mouth 2 (two) times daily after a meal.   HYDROcodone-acetaminophen 5-325 MG tablet Commonly known as:  NORCO Take 1-2 tablets by mouth every 6 (six) hours as needed for moderate pain.   ibuprofen 400 MG tablet Commonly known as:  ADVIL,MOTRIN Take 1 tablet (400 mg total) by mouth every 6 (six) hours.   PROBIOTIC ACIDOPHILUS Caps Take 1 capsule by mouth daily.   vitamin C 250 MG tablet Commonly known as:  ASCORBIC ACID Take 250 mg by mouth daily.   vitamin E 200 UNIT capsule Generic drug:  vitamin E Take 200 Units by mouth daily.   VITAMIN-B COMPLEX PO Take 1 tablet by mouth daily.            Durable Medical Equipment  (From admission, onward)        Start     Ordered   10/09/17 1028  For home use only DME Walker rolling  [facility](Walkers)  Once    Question:  Patient needs a walker to treat with the following condition  Answer:  Hip fracture (HCC)   10/09/17 1029   10/08/17 1221  For home use only DME 3 n 1  Once     10/08/17 1220   10/08/17 1220  For home use only DME Walker rolling  Once    Question:  Patient needs a walker to treat with the following condition  Answer:  S/p left hip fracture   10/08/17 1220     Allergies  Allergen Reactions  . Clindamycin/Lincomycin Diarrhea  . Penicillins    Follow-up Information    Swinteck, Arlys JohnBrian, MD. Schedule an appointment as soon as possible for a visit in 2 weeks.   Specialty:  Orthopedic Surgery Why:  For wound re-check Contact information: [address] [place] [address] [phone]        [facility], [facility] Follow up.   Specialty:  Home Health Services Why:  A repreasentatie from [facility] will contact you to arrange start date and time for your therapy. Contact information: [address] [place] [address] [phone]             The results of significant diagnostics from this hospitalization (including imaging, microbiology, ancillary and laboratory) are listed below for reference.    Significant Diagnostic Studies: Pelvis Portable  Result Date: 10/06/2017 CLINICAL DATA:  Status post left total hip replacement. EXAM: PORTABLE PELVIS 1-2 VIEWS COMPARISON:  Intraoperative fluoroscopic images 10/05/2017. Hip radiographs 10/05/2017. FINDINGS: Sequelae of left total hip arthroplasty are again identified with postoperative gas in the surrounding soft tissues. The prosthetic femoral and acetabular components appear normally located on this single AP image. No acute fracture is identified. Right hip joint space width is preserved. IMPRESSION: Left total hip arthroplasty without evidence of acute complication. Electronically Signed   By: Sebastian AcheAllen  Grady M.D.   On: 10/06/2017 09:12   Dg Chest Port 1 View  Result Date: 10/05/2017 CLINICAL DATA:  Preop for hip fracture.  EXAM: PORTABLE CHEST 1 VIEW COMPARISON:  Radiographs of March 02, 2005. FINDINGS: The heart size and mediastinal contours are within normal limits. Both lungs are clear. The visualized skeletal structures are unremarkable. IMPRESSION: No acute cardiopulmonary abnormality seen. Electronically Signed   By: Lupita Raider, M.D.   On: 10/05/2017 12:34   Dg Knee Left Port  Result Date: 10/05/2017 CLINICAL DATA:  A left femoral neck fracture. Clinical concern for a concomitant left knee fracture. EXAM: PORTABLE LEFT KNEE - 1-2 VIEW COMPARISON:  None. FINDINGS: Normal appearing bones and soft tissues without fracture, dislocation or effusion. IMPRESSION: No fracture. Electronically Signed   By: Beckie Salts M.D.   On: 10/05/2017 13:43   Dg C-arm 1-60 Min  Result Date: 10/05/2017 CLINICAL DATA:  Left hip arthroplasty. EXAM: OPERATIVE LEFT HIP (WITH PELVIS IF PERFORMED)  VIEWS TECHNIQUE: Fluoroscopic spot image(s) were submitted for interpretation post-operatively.  COMPARISON:  Earlier today. FINDINGS: For C-arm views of the left hip demonstrate an interval left hip prosthesis in satisfactory position and alignment. No fracture or dislocation is seen. IMPRESSION: Satisfactory appearance of a left hip prosthesis. Electronically Signed   By: Beckie Salts M.D.   On: 10/05/2017 18:23   Dg Hip Operative Unilat W Or W/o Pelvis Left  Result Date: 10/05/2017 CLINICAL DATA:  Left hip arthroplasty. EXAM: OPERATIVE LEFT HIP (WITH PELVIS IF PERFORMED)  VIEWS TECHNIQUE: Fluoroscopic spot image(s) were submitted for interpretation post-operatively. COMPARISON:  Earlier today. FINDINGS: For C-arm views of the left hip demonstrate an interval left hip prosthesis in satisfactory position and alignment. No fracture or dislocation is seen. IMPRESSION: Satisfactory appearance of a left hip prosthesis. Electronically Signed   By: Beckie Salts M.D.   On: 10/05/2017 18:23   Dg Hip Unilat With Pelvis 2-3 Views Left  Result Date: 10/05/2017 CLINICAL DATA:  LEFT hip pain after falling onto LEFT hip this morning when she tripped over something EXAM: DG HIP (WITH OR WITHOUT PELVIS) 2-3V LEFT COMPARISON:  None. FINDINGS: Osseous demineralization normal. Joint spaces preserved. Impacted subcapital fracture of the LEFT femoral neck. No dislocation. Pelvis intact. IMPRESSION: Impacted subcapital fracture of the LEFT femoral neck. Electronically Signed   By: Ulyses Southward M.D.   On: 10/05/2017 11:53    Microbiology: Recent Results (from the past 240 hour(s))  Urine culture     Status: Abnormal   Collection Time: 10/05/17 11:38 AM  Result Value Ref Range Status   Specimen Description URINE, RANDOM  Final   Special Requests NONE  Final   Culture (A)  Final    <10,000 COLONIES/mL INSIGNIFICANT GROWTH Performed at [facility], [address], [place], [place] [address]    Report Status 10/06/2017 FINAL  Final     Labs: Basic Metabolic Panel: Recent Labs  Lab 10/05/17 1216  10/06/17 0522 10/07/17 0610 10/07/17 1132 10/08/17 0426  NA 133* 128* 126*  --  134*  K 3.5 3.7 3.6  --  3.7  CL 97* 98 94*  --  100  CO2 24 23 26   --  27  GLUCOSE 124* 118* 113*  --  112*  BUN 6* <5* <5*  --  <5*  CREATININE 0.58 0.44 0.42*  --  0.48  CALCIUM 10.8* 8.8* 8.8* 8.7 9.4  MG  --   --   --  1.7  --    Liver Function Tests: Recent Labs  Lab 10/06/17 0522 10/08/17 0426  AST 38 40  ALT 22 21  ALKPHOS 46 38  BILITOT 1.9* 1.3*  PROT 5.6* 5.4*  ALBUMIN 3.6 3.2*   No results for input(s): LIPASE, AMYLASE in the last 168 hours. No results for input(s): AMMONIA in the last 168 hours. CBC: Recent Labs  Lab 10/05/17 1216 10/06/17 0522 10/07/17 0610 10/08/17 0426 10/08/17 2114  WBC 10.0 7.6 5.2 4.4  --   NEUTROABS 8.5*  --   --  2.7  --   HGB 14.4 10.9* 8.7* 8.6* 9.3*  HCT 43.3 32.4* 25.8* 25.0* 27.7*  MCV 87.1 86.4 85.4 85.9  --   PLT 182 150 123* 129*  --    Cardiac Enzymes: No results for input(s): CKTOTAL, CKMB, CKMBINDEX, TROPONINI in the last 168 hours. BNP: BNP (last 3 results) No results for input(s): BNP in the last 8760 hours.  ProBNP (last 3 results) No results for input(s): PROBNP in the last 8760 hours.  CBG: No results for input(s): GLUCAP in the last 168 hours.     Signed:  Rhetta Mura MD   [facility] 10/09/2017, 10:29 AM

## 2017-10-09 NOTE — Progress Notes (Addendum)
Physical Therapy Treatment Patient Details Name: Christina Chase MRN: [id number] DOB: 07/09/1945 Today's Date: 10/09/2017    History of Present Illness Pt is a 72 y.o. female with medical history significant of sensitivity to medications who presented to the ED after a fall at home. Xray revealed L hip fx. She underwent L THA 10-05-17.      PT Comments    Pt performed gait and stair training to prepare for d/c home this pm.  Husband present and observed technique.  Pt remains nervous throughout session and has difficulty focusing on activities.  Plan for support from spouse who has good recall with stair negotiation and mobility.    Follow Up Recommendations  Home health PT;Supervision/Assistance - 24 hour     Equipment Recommendations  Rolling walker with 5" wheels;3in1 (PT)    Recommendations for Other Services       Precautions / Restrictions Precautions Precautions: Fall Precaution Comments: 10/08/2017 dizziness with standing (to receive 1 unit of blood); watch orthostatic vitals  Restrictions Weight Bearing Restrictions: Yes LLE Weight Bearing: Weight bearing as tolerated    Mobility  Bed Mobility Overal bed mobility: Needs Assistance Bed Mobility: Supine to Sit     Supine to sit: Supervision     General bed mobility comments: Increased time and effort.  Pt kept scooting toward foot of bed as she reports," this bed is not even."  Transfers Overall transfer level: Needs assistance Equipment used: Rolling walker (2 wheeled) Transfers: Sit to/from Stand Sit to Stand: Min guard         General transfer comment: Min guard for safety as she c/o feeling weaker today.    Ambulation/Gait Ambulation/Gait assistance: Min guard Gait Distance (Feet): 75 Feet Assistive device: Rolling walker (2 wheeled) Gait Pattern/deviations: Step-to pattern;Decreased stride length;Decreased weight shift to left;Decreased step length - right;Antalgic Gait velocity: decreased   General  Gait Details: Cues for upper trunk control, RW safety and sequencing.      Stairs Stairs: Yes Stairs assistance: Min assist Stair Management: No rails;Backwards;Step to pattern Number of Stairs: 2 General stair comments: Cues for sequencing and RW placement.  Pt reports feeling nervous about climbing "real" stairs.  Issued handout and husband present to observe technique.     Wheelchair Mobility    Modified Rankin (Stroke Patients Only)       Balance Overall balance assessment: Needs assistance   Sitting balance-Leahy Scale: Good       Standing balance-Leahy Scale: Fair                              Cognition Arousal/Alertness: Awake/alert Behavior During Therapy: WFL for tasks assessed/performed Overall Cognitive Status: Within Functional Limits for tasks assessed                                 General Comments: Pt remains to perseverate on medications she has recieved, needing to use the bathroom due to lasix, getting blood yesterday, and feeling like her limbs aren't even.  It is difficult to get her to follow commands.        Exercises      General Comments        Pertinent Vitals/Pain Pain Assessment: Faces Faces Pain Scale: Hurts even more Pain Location: L hip surgical site  Pain Descriptors / Indicators: Aching;Sore Pain Intervention(s): Monitored during session;Repositioned    Home Living  Prior Function            PT Goals (current goals can now be found in the care plan section) Acute Rehab PT Goals Patient Stated Goal: "I need to know why my leg feels longer." Potential to Achieve Goals: Good Progress towards PT goals: Progressing toward goals    Frequency    Min 5X/week      PT Plan Current plan remains appropriate    Co-evaluation              AM-PAC PT "6 Clicks" Daily Activity  Outcome Measure  Difficulty turning over in bed (including adjusting bedclothes, sheets  and blankets)?: None Difficulty moving from lying on back to sitting on the side of the bed? : None Difficulty sitting down on and standing up from a chair with arms (e.g., wheelchair, bedside commode, etc,.)?: A Little Help needed moving to and from a bed to chair (including a wheelchair)?: A Little Help needed walking in hospital room?: A Little Help needed climbing 3-5 steps with a railing? : A Little 6 Click Score: 20    End of Session Equipment Utilized During Treatment: Gait belt Activity Tolerance: (Pt presents with self limiting behavior) Patient left: with bed alarm set;with call bell/phone within reach;with family/visitor present;in chair Nurse Communication: Mobility status PT Visit Diagnosis: Other abnormalities of gait and mobility (R26.89);Pain;Difficulty in walking, not elsewhere classified (R26.2) Pain - Right/Left: Left Pain - part of body: Hip     Time: [id number] PT Time Calculation (min) (ACUTE ONLY): 34 min  Charges:  $Gait Training: 8-22 mins $Therapeutic Activity: 8-22 mins                     Joycelyn RuaAimee Aydee Mcnew, PTA pager [phone]    Florestine AversAimee J Eiliana Drone 10/09/2017, 2:32 PM

## 2017-10-09 NOTE — Progress Notes (Signed)
AVS given and reviewed with pt and pt's husband. Printed prescriptions provided. All questions answered to satisfaction. Pt to be escorted off the unit via wheelchair by volunteer services.

## 2017-10-18 DIAGNOSIS — M25552 Pain in left hip: Secondary | ICD-10-CM | POA: Diagnosis not present

## 2017-10-22 DIAGNOSIS — S72032D Displaced midcervical fracture of left femur, subsequent encounter for closed fracture with routine healing: Secondary | ICD-10-CM | POA: Diagnosis not present

## 2017-10-23 ENCOUNTER — Encounter: Payer: Self-pay | Admitting: Family Medicine

## 2017-10-23 ENCOUNTER — Ambulatory Visit (INDEPENDENT_AMBULATORY_CARE_PROVIDER_SITE_OTHER): Payer: Medicare Other | Admitting: Family Medicine

## 2017-10-23 VITALS — BP 128/70 | HR 110 | Temp 98.2°F | Resp 16 | Ht 63.5 in | Wt 109.0 lb

## 2017-10-23 DIAGNOSIS — R6889 Other general symptoms and signs: Secondary | ICD-10-CM | POA: Diagnosis not present

## 2017-10-23 DIAGNOSIS — Z09 Encounter for follow-up examination after completed treatment for conditions other than malignant neoplasm: Secondary | ICD-10-CM | POA: Diagnosis not present

## 2017-10-23 DIAGNOSIS — S72002A Fracture of unspecified part of neck of left femur, initial encounter for closed fracture: Secondary | ICD-10-CM

## 2017-10-23 DIAGNOSIS — D62 Acute posthemorrhagic anemia: Secondary | ICD-10-CM | POA: Diagnosis not present

## 2017-10-24 LAB — COMPLETE METABOLIC PANEL WITH GFR
AG RATIO: 2 (calc) (ref 1.0–2.5)
ALBUMIN MSPROF: 4.2 g/dL (ref 3.6–5.1)
ALT: 12 U/L (ref 6–29)
AST: 20 U/L (ref 10–35)
Alkaline phosphatase (APISO): 85 U/L (ref 33–130)
BILIRUBIN TOTAL: 1.1 mg/dL (ref 0.2–1.2)
BUN / CREAT RATIO: 13 (calc) (ref 6–22)
BUN: 7 mg/dL (ref 7–25)
CO2: 25 mmol/L (ref 20–32)
Calcium: 9.9 mg/dL (ref 8.6–10.4)
Chloride: 103 mmol/L (ref 98–110)
Creat: 0.55 mg/dL — ABNORMAL LOW (ref 0.60–0.93)
GFR, EST AFRICAN AMERICAN: 107 mL/min/{1.73_m2} (ref >=60)
GFR, Est Non African American: 92 mL/min/{1.73_m2} (ref >=60)
GLOBULIN: 2.1 g/dL (ref 1.9–3.7)
Glucose, Bld: 99 mg/dL (ref 65–99)
POTASSIUM: 4.3 mmol/L (ref 3.5–5.3)
SODIUM: 139 mmol/L (ref 135–146)
TOTAL PROTEIN: 6.3 g/dL (ref 6.1–8.1)

## 2017-10-24 LAB — CBC WITH DIFFERENTIAL/PLATELET
BASOS ABS: 32 {cells}/uL (ref 0–200)
BASOS PCT: 0.6 %
EOS ABS: 178 {cells}/uL (ref 15–500)
Eosinophils Relative: 3.3 %
HEMATOCRIT: 34.1 % — AB (ref 35.0–45.0)
HEMOGLOBIN: 11.3 g/dL — AB (ref 11.7–15.5)
Lymphs Abs: 902 cells/uL (ref 850–3900)
MCH: 29.3 pg (ref 27.0–33.0)
MCHC: 33.1 g/dL (ref 32.0–36.0)
MCV: 88.3 fL (ref 80.0–100.0)
MONOS PCT: 9.3 %
MPV: 10.6 fL (ref 7.5–12.5)
Neutro Abs: 3785 cells/uL (ref 1500–7800)
Neutrophils Relative %: 70.1 %
Platelets: 404 10*3/uL — ABNORMAL HIGH (ref 140–400)
RBC: 3.86 10*6/uL (ref 3.80–5.10)
RDW: 14.1 % (ref 11.0–15.0)
Total Lymphocyte: 16.7 %
WBC: 5.4 10*3/uL (ref 3.8–10.8)
WBCMIX: 502 {cells}/uL (ref 200–950)

## 2017-10-24 LAB — FERRITIN: Ferritin: 214 ng/mL (ref 16–288)

## 2017-10-24 LAB — IRON: Iron: 45 ug/dL (ref 45–160)

## 2017-10-24 LAB — PARATHYROID HORMONE, INTACT (NO CA): PTH: 104 pg/mL — ABNORMAL HIGH (ref 14–64)

## 2017-10-24 LAB — VITAMIN B12: Vitamin B-12: 714 pg/mL (ref 200–1100)

## 2017-10-24 LAB — RETICULOCYTES
ABS RETIC: 158260 {cells}/uL — AB (ref 20000–8000)
Retic Ct Pct: 4.1 %

## 2017-10-24 NOTE — Progress Notes (Signed)
Subjective:    Patient ID: Christina Chase, female    DOB: Jun 20, 1945, 72 y.o.   MRN: [id number]  HPI Patient is a [name] who presents today for hospital discharge follow-up.  I have previously only met the patient one time in May 2018.  Unfortunately the patient recently suffered a fall after she lost her balance and fell over a piece of furniture suffering a left hip fracture.  I have copied relevant portions of the discharge summary and included them below for my reference: Admit date: 10/05/2017 Discharge date: 10/09/2017  History of present illness:  [name]'s past medical history, fall and hip fracture-Swinteck MD consulted for orthopedics-had surgery 8/2   Hospital Course:   Hip fracture-Defer to orthopedics planning weightbearing pain control etc--she will need to continue aspirin twice a day at least for 4 to 6 weeks as per Dr. Lyla Glassing and follow-up with him she has declined multiple opiates and other pain modalities for treatment so I have recommended Tylenol or ibuprofen.  Hypovolemic hyponatremia plus mild hypercalcemia + mild hypercalcemia --sodiumresolved with lasix/fluid restriction-labs showed resolution of the same with a little bit of Lasix and fluid  Mild ABLA expected-transfuse 1 U if she allowsas was orthostatic and woosy when ambulating with PT--seems to have resolved her hemoglobin is 9.3 down from 14 she will need outpatient lab checks and diet rich in iron and chooses to use vegetables more so than meat  Hypocalcemia-May need supplementation-monitor  Impaired glucose tolerance-sugars 118-124-A1c would correspond to less than 5.2  Mild hyperlipidemia LDL last year 127 total cholesterol 218-outpatient management  Mild hyperbilirubinemia-monitor in a.m. 8/5  Please screen this patient as an outpatient for either anxiety/Orthorexia--she is very specific about some of her choices in terms of pain meds diet and I believe  she may benefit from some cognitive behavioral therapy Procedures:  Surgery   Consultations:  Orthopedics Dr. Lyla Glassing           TAKE these medications          acetaminophen 325 MG tablet Commonly known as:  TYLENOL Take 1-2 tablets (325-650 mg total) by mouth every 6 (six) hours as needed for mild pain (pain score 1-3 or temp > 100.5).   aspirin 81 MG tablet Take 1 tablet (81 mg total) by mouth 2 (two) times daily after a meal.   HYDROcodone-acetaminophen 5-325 MG tablet Commonly known as:  NORCO Take 1-2 tablets by mouth every 6 (six) hours as needed for moderate pain.   ibuprofen 400 MG tablet Commonly known as:  ADVIL,MOTRIN Take 1 tablet (400 mg total) by mouth every 6 (six) hours.   PROBIOTIC ACIDOPHILUS Caps Take 1 capsule by mouth daily.   vitamin C 250 MG tablet Commonly known as:  ASCORBIC ACID Take 250 mg by mouth daily.   vitamin E 200 UNIT capsule Generic drug:  vitamin E Take 200 Units by mouth daily.   VITAMIN-B COMPLEX PO Take 1 tablet by mouth daily.      10/24/17 Patient suffered an impacted subcapital fracture of the left femoral neck on August 2.  She underwent surgery and had a hip prosthesis (THR) placed on August 2.  Postoperative course was significant for a drop in her hemoglobin from 14 down to a nadir of 8.7.  He received a transfusion and was discharged from the hospital with a hemoglobin of 9.3.  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  Patient states that she was not fed in the hospital for several days and that she was drinking large quantities of water.  She believes this is the reason her sodium level dropped.  She also had low calcium in the hospital and her vitamin D level was extremely low at 17.  Patient has very strong opinions about medication.  She refuses any pain medication.  She refuses Tylenol.  She refuses  anti-inflammatories.  She also refuses to take vitamin D because it causes her "heart racing".  She has been hesitant to take any iron.  She is willing to take B vitamins.  Today on presentation, the patient appears frail.  She appears underweight for her height.  She is also mildly tachycardic leading me to believe she has low effective blood volume.  She denies any chest pain shortness of breath or dyspnea on exertion.  She saw her orthopedist yesterday.  However, when asked how she was recovering, the patient remarked about how uncomfortable the chair in his office was.  Because of this she is in a great deal of pain this morning.  No past medical history on file. Past Surgical History:  Procedure Laterality Date  . 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 acetaminophen (TYLENOL) 325 MG tablet Take 1-2 tablets (325-650 mg total) by mouth every 6 (six) hours as needed for mild pain (pain score 1-3 or temp > 100.5).    Marland Kitchen aspirin 81 MG tablet Take 1 tablet (81 mg total) by mouth 2 (two) times daily after a meal. 60 tablet 1  . B Complex Vitamins (VITAMIN-B COMPLEX PO) Take 1 tablet by mouth daily.    Marland Kitchen HYDROcodone-acetaminophen (NORCO) 5-325 MG tablet Take 1-2 tablets by mouth every 6 (six) hours as needed for moderate pain. 30 tablet 0  . ibuprofen (ADVIL,MOTRIN) 400 MG tablet Take 1 tablet (400 mg total) by mouth every 6 (six) hours. 30 tablet 0  . Lactobacillus (PROBIOTIC ACIDOPHILUS) CAPS Take 1 capsule by mouth daily.    . vitamin C (ASCORBIC ACID) 250 MG tablet Take 250 mg by mouth daily.    . vitamin E (VITAMIN E) 200 UNIT capsule Take 200 Units by mouth daily.     No current facility-administered medications on file prior to visit.    Allergies  Allergen  Reactions  . Clindamycin/Lincomycin Diarrhea  . Penicillins    Social History   Socioeconomic History  . Marital status: Married    Spouse name: Not on file  . Number of children: Not on file  . Years of education: Not on file  . Highest education level: Not on file  Occupational History  . Not on file  Social Needs  . Financial resource strain: Not on file  . Food insecurity:    Worry: Not on file    Inability: Not on file  . Transportation needs:    Medical: Not on file    Non-medical: Not on file  Tobacco Use  . Smoking status: Never Smoker  . Smokeless tobacco: Never Used  Substance and Sexual Activity  . Alcohol use: No  . Drug use: No  . Sexual activity: Not on file  Lifestyle  . Physical activity:    Days per week: Not on file  Minutes per session: Not on file  . Stress: Not on file  Relationships  . Social connections:    Talks on phone: Not on file    Gets together: Not on file    Attends religious service: Not on file    Active member of club or organization: Not on file    Attends meetings of clubs or organizations: Not on file    Relationship status: Not on file  . Intimate partner violence:    Fear of current or ex partner: Not on file    Emotionally abused: Not on file    Physically abused: Not on file    Forced sexual activity: Not on file  Other Topics Concern  . Not on file  Social History Narrative  . Not on file    Review of Systems  All other systems reviewed and are negative.      Objective:    Constitutional: She is oriented to person, place, and time. She appears well-developed and well-nourished. No distress.  HENT:  Head: Normocephalic and atraumatic.  Cardiovascular: Tachycardia, regular rhythm, normal heart sounds and intact distal pulses.  Exam reveals no gallop and no friction rub.   No murmur heard. Pulmonary/Chest: Effort normal and breath sounds normal. No stridor. No respiratory distress. She has no wheezes. She has no  rales. She exhibits no tenderness.  Abdominal: Soft. Bowel sounds are normal. She exhibits no distension and no mass. There is no tenderness. There is no rebound and no guarding.  Neurological: She is alert and oriented to person, place, and time.  No cranial nerve deficit. She exhibits normal muscle tone. Coordination normal.  Skin: No rash noted. She is not diaphoretic.         Assessment & Plan:   Acute blood loss anemia - Plan: CBC with Differential/Platelet, COMPLETE METABOLIC PANEL WITH GFR, Iron, Vitamin B12, Reticulocytes, Ferritin  Hypocalcemia - Plan: Parathyroid hormone, intact (no Ca)  Displaced fracture of left femoral neck [facility])  Hospital discharge follow-up  Patient has very strong opinions regarding her diet, vitamins, medications, and what she is willing to take.  Cardiovert visit was spent discussing some of the lab abnormalities that were discovered during her hospitalization.  First and foremost I am concerned about her anemia.  There is a significant drop in her hemoglobin from 14-8.6.  She was 9.3 at discharge.  I will repeat a CBC today.  I have recommended iron sulfate 325 mg p.o. daily.  She seems very hesitant to only take this but I emphasized to the patient would help rebuild her blood counts.  I would also obtain an iron level, vitamin B12 level, and a ferritin to determine if the patient demonstrates vitamin or mineral deficiencies given her restricted diet that may compound her anemia.  I am very concerned about her hypocalcemia and her low vitamin D.  I will check a PTH to evaluate for hypoparathyroidism however I suspect that this is due to a deficiency and vitamin D as well as calcium.  This also makes me concerned she may be osteoporotic given her thin build.  Therefore I recommended a bone density scan.  I have recommended 50,000 units of vitamin D once a week for 6 months because of her low vitamin D level.  I will send this to her pharmacy.  I will schedule  her for a bone density test.  I will repeat a CMP to monitor for hyponatremia and if present, I will occur up for SIADH.

## 2017-10-25 ENCOUNTER — Other Ambulatory Visit: Payer: Self-pay | Admitting: Family Medicine

## 2017-10-25 MED ORDER — VITAMIN D (ERGOCALCIFEROL) 1.25 MG (50000 UNIT) PO CAPS: 50000.0000 [IU] | ORAL_CAPSULE | ORAL | 1 refills | Status: DC

## 2017-10-30 DIAGNOSIS — M25552 Pain in left hip: Secondary | ICD-10-CM | POA: Diagnosis not present

## 2017-11-01 DIAGNOSIS — M25552 Pain in left hip: Secondary | ICD-10-CM | POA: Diagnosis not present

## 2017-11-13 DIAGNOSIS — M25552 Pain in left hip: Secondary | ICD-10-CM | POA: Diagnosis not present

## 2017-11-15 DIAGNOSIS — M25552 Pain in left hip: Secondary | ICD-10-CM | POA: Diagnosis not present

## 2017-11-27 DIAGNOSIS — Z96642 Presence of left artificial hip joint: Secondary | ICD-10-CM | POA: Diagnosis not present

## 2017-11-27 DIAGNOSIS — S72032D Displaced midcervical fracture of left femur, subsequent encounter for closed fracture with routine healing: Secondary | ICD-10-CM | POA: Diagnosis not present

## 2017-11-28 DIAGNOSIS — M25552 Pain in left hip: Secondary | ICD-10-CM | POA: Diagnosis not present

## 2017-12-07 DIAGNOSIS — M25552 Pain in left hip: Secondary | ICD-10-CM | POA: Diagnosis not present

## 2017-12-12 DIAGNOSIS — M25552 Pain in left hip: Secondary | ICD-10-CM | POA: Diagnosis not present

## 2018-05-06 DIAGNOSIS — S72032D Displaced midcervical fracture of left femur, subsequent encounter for closed fracture with routine healing: Secondary | ICD-10-CM | POA: Diagnosis not present

## 2018-09-11 ENCOUNTER — Other Ambulatory Visit: Payer: Self-pay

## 2018-09-11 ENCOUNTER — Ambulatory Visit (INDEPENDENT_AMBULATORY_CARE_PROVIDER_SITE_OTHER): Payer: Medicare Other | Admitting: Family Medicine

## 2018-09-11 ENCOUNTER — Telehealth: Payer: Self-pay | Admitting: General Practice

## 2018-09-11 ENCOUNTER — Encounter: Payer: Self-pay | Admitting: Family Medicine

## 2018-09-11 VITALS — BP 150/94 | HR 105 | Temp 98.5°F | Ht 64.0 in | Wt 100.0 lb

## 2018-09-11 DIAGNOSIS — Z20822 Contact with and (suspected) exposure to covid-19: Secondary | ICD-10-CM

## 2018-09-11 DIAGNOSIS — R6889 Other general symptoms and signs: Secondary | ICD-10-CM | POA: Diagnosis not present

## 2018-09-11 DIAGNOSIS — J329 Chronic sinusitis, unspecified: Secondary | ICD-10-CM | POA: Diagnosis not present

## 2018-09-11 DIAGNOSIS — D62 Acute posthemorrhagic anemia: Secondary | ICD-10-CM | POA: Diagnosis not present

## 2018-09-11 DIAGNOSIS — R110 Nausea: Secondary | ICD-10-CM | POA: Diagnosis not present

## 2018-09-11 NOTE — Telephone Encounter (Signed)
Pt has been scheduled for covid testing.  Pt was referred by: Delsa Grana, PA-C

## 2018-09-11 NOTE — Addendum Note (Signed)
Addended by: Dimple Nanas on: 09/11/2018 03:59 PM   Modules accepted: Orders

## 2018-09-11 NOTE — Progress Notes (Deleted)
Patient ID: Christina Chase, female    DOB: 1945/12/23, 73 y.o.   MRN: [id number]  PCP: Susy Frizzle, MD  Virtual Visit via telephone  Phone visit arranged with Johnella Moloney for 09/11/18 at  2:00 PM EDT  Services provided today were via telemedicine through telephone call. Start of phone call:  2:03 PM g I verified that I was speaking with the correct person using two identifiers. Patient reported their location during encounter was at home  Patient consented to telephone visit  I conducted telephone visit from [facility]  Referring Provider:   Susy Frizzle, MD   All participants in encounter:  Myself and the patient  I discussed the limitations, risks, security and privacy concerns of performing an evaluation and management service by telephone and the availability of in person appointments. I also discussed with the patient that there may be a patient responsible charge related to this service. The patient expressed understanding and agreed to proceed.  No chief complaint on file.   Subjective:   Christina Chase is a 73 y.o. female, presents to clinic with CC of *** HPI   Pt complains of flu-like sx and over a year of "problems with viruses."  Sx first started 3 weeks ago, believes she got sick from her husband, she thinks she got sick from him, says its going in "loops" with fever of "going up to 98.5" cause "her normal is 97.5 or 97.6."  She also had headaches, body aches, pain near her past surgery sites (nerves in legs? Back?), trouble walking, some mild nasal sx.  It keeps going in waves, as soon as she feels like she's getting better, for only a day or two, then she gets sick again.   She's "not able to eat at all" feels like she's going to throw up.  Has some "spiked, sharp tiny pain" that goes up her shoulders and down her back.    Right now her nose is congested, "swelling in the nose", GI sx and "temperature" with some N, V, D.       Patient Active Problem List   Diagnosis Date Noted  . Closed left hip fracture, initial encounter ([place]) 10/05/2017  . Displaced fracture of left femoral neck ([place])     Prior to Admission medications   Medication Sig Start Date End Date Taking? Authorizing Provider  acetaminophen (TYLENOL) 325 MG tablet Take 1-2 tablets (325-650 mg total) by mouth every 6 (six) hours as needed for mild pain (pain score 1-3 or temp > 100.5). 10/09/17   Nita Sells, MD  aspirin 81 MG tablet Take 1 tablet (81 mg total) by mouth 2 (two) times daily after a meal. 10/08/17   Swinteck, Aaron Edelman, MD  B Complex Vitamins (VITAMIN-B COMPLEX PO) Take 1 tablet by mouth daily.    [provider]  HYDROcodone-acetaminophen (NORCO) 5-325 MG tablet Take 1-2 tablets by mouth every 6 (six) hours as needed for moderate pain. 10/08/17   Swinteck, Aaron Edelman, MD  ibuprofen (ADVIL,MOTRIN) 400 MG tablet Take 1 tablet (400 mg total) by mouth every 6 (six) hours. 10/09/17   Nita Sells, MD  Lactobacillus (PROBIOTIC ACIDOPHILUS) CAPS Take 1 capsule by mouth daily.    [provider]  vitamin C (ASCORBIC ACID) 250 MG tablet Take 250 mg by mouth daily.    [provider]  Vitamin D, Ergocalciferol, (DRISDOL) 50000 units CAPS capsule Take 1 capsule (50,000 Units total) by mouth every 7 (seven) days. 10/25/17  Donita BrooksPickard, Warren T, MD  vitamin E (VITAMIN E) 200 UNIT capsule Take 200 Units by mouth daily.    [provider]    Allergies  Allergen Reactions  . Clindamycin/Lincomycin Diarrhea  . Penicillins     Review of Systems     Objective:    There were no vitals filed for this visit.    Physical Exam       Assessment & Plan:   No diagnosis found.   I discussed the assessment and treatment plan with the patient. The patient was provided an opportunity to ask questions and all were answered. The patient agreed with the plan and demonstrated an understanding of the instructions.    The patient was advised to call back or seek an in-person evaluation if the symptoms worsen or if the condition fails to improve as anticipated.  Phone call concluded at *** I provided *** minutes of non-face-to-face time during this encounter.  Danelle BerryLeisa Domonic Kimball, PA-C 09/11/18 2:03 PM

## 2018-09-12 ENCOUNTER — Other Ambulatory Visit: Payer: Medicare Other

## 2018-09-12 ENCOUNTER — Ambulatory Visit: Payer: Medicare Other | Admitting: Family Medicine

## 2018-09-12 DIAGNOSIS — R6889 Other general symptoms and signs: Secondary | ICD-10-CM | POA: Diagnosis not present

## 2018-09-12 DIAGNOSIS — Z20822 Contact with and (suspected) exposure to covid-19: Secondary | ICD-10-CM

## 2018-09-16 LAB — NOVEL CORONAVIRUS, NAA: SARS-CoV-2, NAA: NOT DETECTED

## 2018-09-18 NOTE — Progress Notes (Signed)
Patient ID: Christina Chase, female    DOB: [birth year], 73 y.o.   MRN: [id number]  PCP: Susy Frizzle, MD  Virtual Visit via telephone Phone visit arranged with Johnella Moloney for 09/11/18 at  2:00 PM EDT   Services provided today were via telemedicine through telephone call.  Start of phone call:  2:03 PM  I verified that I was speaking with the correct person using two identifiers.  Patient reported their location during encounter was at home  Patient consented to telephone visit  I conducted telephone visit from [facility]   Referring Provider:   Susy Frizzle, MD    All participants in encounter:              Myself and the patient   I discussed the limitations, risks, security and privacy concerns of performing an evaluation and management service by telephone and the availability of in person appointments. I also discussed with the patient that there may be a patient responsible charge related to this service. The patient expressed understanding and agreed to proceed.   Chief Complaint  Patient presents with  . Nasal Congestion  . Nausea    Subjective:   Christina Chase is a 73 y.o. female, presents with CC of flu-like sx and over a year of "problems with viruses."  Sx first started 3 weeks ago, believes she got sick from her husband, she thinks she got sick from him, says its going in "loops" with fever of "going up to 98.5" cause "her normal is 97.5 or 97.6."  She also had headaches, body aches, pain near her past surgery sites (nerves in legs? Back?), trouble walking, some mild nasal sx.  It keeps going in waves, as soon as she feels like she's getting better, for only a day or two, then she gets sick again.    She's "not able to eat at all" feels like she's going to throw up.  Has some "spiked, sharp tiny pain" that goes up her shoulders and down her back.    Right now her nose is congested, "swelling in the nose", GI sx and  "temperature" with some N, V, D.   Patient Active Problem List   Diagnosis Date Noted  . Closed left hip fracture, initial encounter ([place]) 10/05/2017  . Displaced fracture of left femoral neck ([place])     Prior to Admission medications   Medication Sig Start Date End Date Taking? Authorizing Provider  acetaminophen (TYLENOL) 325 MG tablet Take 1-2 tablets (325-650 mg total) by mouth every 6 (six) hours as needed for mild pain (pain score 1-3 or temp > 100.5). 10/09/17   Nita Sells, MD  aspirin 81 MG tablet Take 1 tablet (81 mg total) by mouth 2 (two) times daily after a meal. 10/08/17   Swinteck, Aaron Edelman, MD  B Complex Vitamins (VITAMIN-B COMPLEX PO) Take 1 tablet by mouth daily.    [provider]  HYDROcodone-acetaminophen (NORCO) 5-325 MG tablet Take 1-2 tablets by mouth every 6 (six) hours as needed for moderate pain. 10/08/17   Swinteck, Aaron Edelman, MD  ibuprofen (ADVIL,MOTRIN) 400 MG tablet Take 1 tablet (400 mg total) by mouth every 6 (six) hours. 10/09/17   Nita Sells, MD  Lactobacillus (PROBIOTIC ACIDOPHILUS) CAPS Take 1 capsule by mouth daily.    [provider]  vitamin C (ASCORBIC ACID) 250 MG tablet Take 250 mg by mouth daily.    [provider]  Vitamin D, Ergocalciferol, (DRISDOL) 50000 units  CAPS capsule Take 1 capsule (50,000 Units total) by mouth every 7 (seven) days. 10/25/17   Donita BrooksPickard, Warren T, MD  vitamin E (VITAMIN E) 200 UNIT capsule Take 200 Units by mouth daily.    [provider]    Allergies  Allergen Reactions  . Clindamycin/Lincomycin Diarrhea  . Penicillins     Review of Systems  Constitutional: Negative.   HENT: Negative.   Eyes: Negative.   Respiratory: Negative.   Cardiovascular: Negative.   Gastrointestinal: Negative.   Endocrine: Negative.   Genitourinary: Negative.   Musculoskeletal: Negative.   Skin: Negative.   Allergic/Immunologic: Negative.   Neurological: Negative.   Hematological: Negative.    Psychiatric/Behavioral: Negative.   All other systems reviewed and are negative.      Objective:    Vitals:   09/11/18 1415  BP: (!) 150/94  Pulse: (!) 105  Temp: 98.5 F (36.9 C)  Weight: 100 lb (45.4 kg)  Height: 5\' 4"  (1.626 m)      Physical Exam  Physical exam limited secondary to telephone encounter, clear complete sentences, normal phonation, answers questions generally appropriately, no audible wheeze or stridor, able to speak in full and complete sentences, sounds anxious and nervous     Assessment & Plan:     ICD-10-CM   1. Nausea  R11.0 COMPLETE METABOLIC PANEL WITH GFR    Lipase   offered antiemetics but refused (N may be part of viral illness?)  2. Rhinosinusitis  J32.9    allergies vs viral, r/o COVID, currently does not sound like meets criteria yet for acute bacterial sinusitis, pt encouraged to f/up if worsening  3. Flu-like symptoms  R68.89    pt and husband ill, urged to isolate and get COVID tested  4. Multiple complaints  R68.89 CBC (INCLUDES DIFF/PLT) WITH PATHOLOGIST REVIEW    Reticulocytes   prone to illness since one year ago after surgery, multiple bizarre sx and suspected reactions and changes to immune sx, encouraged f/up pcp with chronic issues  5. Acute blood loss anemia  D62 CBC (INCLUDES DIFF/PLT) WITH PATHOLOGIST REVIEW    Reticulocytes   hx of acute blood loss anemia, offered to order future labs to recheck blood levels, do blood smear and reticulocyte count to help asses her heme concerns  6. Suspected 2019 Novel Coronavirus Infection  R68.89      Very anxious and paranoid pt with many sx acute and chronic, all seem to have started after hip replacement surgery a year ago, blood draws, blood transfusions, and 2 doses of an antibiotic.  More recently husband was ill with "flu-like" sx and she developed the same 3 weeks ago, seems to by cycling or going in waves.     I did recommend to her follow-up visit with her PCP when she is feeling  well.  Since her and her husband are having viral symptoms I did recommend COVID testing and [facility].  She is extremely concerned about her immune system, her prior anemia, blood transfusion, and multiple other concerns and symptoms that would be best addressed by her PCP who knows her and knows all of the circumstances.  I did put in some lab work for her to be done here in clinic if she feels that she can come into the clinic however she states that she is avoiding anywhere that other people are, she does not want to go anywhere for testing, she does not want any blood taken because she  is afraid that it will cause her to pass out or become anemic again.     To assess her immune system feel a CBC with differential is adequate, blood smear and reticulocytes to follow-up on her anemia which appears to have been isolated and secondary to surgery.  I discussed the assessment and treatment plan with the patient. The patient was provided an opportunity to ask questions and all were answered. The patient agreed with the plan and demonstrated an understanding of the instructions.   The patient was advised to call back or seek an in-person evaluation if the symptoms worsen or if the condition fails to improve as anticipated.  Phone call concluded at 2:31 PM   I provided 28 minutes of non-face-to-face time during this encounter.     Danelle BerryLeisa Eleora Sutherland, PA-C  09/11/18 2:03 PM

## 2018-09-23 ENCOUNTER — Ambulatory Visit (INDEPENDENT_AMBULATORY_CARE_PROVIDER_SITE_OTHER): Payer: Medicare Other | Admitting: Family Medicine

## 2018-09-23 ENCOUNTER — Other Ambulatory Visit: Payer: Self-pay

## 2018-09-23 DIAGNOSIS — R6881 Early satiety: Secondary | ICD-10-CM | POA: Diagnosis not present

## 2018-09-23 NOTE — Progress Notes (Signed)
Subjective:    Patient ID: Christina Jeffersonatricia Christina Chase, female    DOB: 1945-04-25, 73 y.o.   MRN: [id number]  HPI  Patient is being seen today as a telephone visit.  She consents to be seen by telephone.  Phone call began at 1115.  Phone call concluded at 1135.  Patient is a very difficult historian.  It was very difficult for me to follow her history which was quite meandering and it was also difficult to ascertain exactly what her symptoms were that are prompting the phone visit.  She starts by reporting a virus that she had March.  She describes the symptoms as sores in her mouth that felt like a bruise.  Symptoms lasted 2 or 3 weeks and included a cough and runny nose.  She states that everybody had.  By everybody she means her close contacts in family members.  They recovered.  However she has cycles as best I can ascertain where her symptoms will return.  Symptoms will include a headache that wraps around her head in a bandlike fashion.  She is unable to specify how long the headache last.  However as best I can tell it seems to abate quickly in less than a day.  Run a "low-grade fever for me".  Temperature is usually 98-98.7 however she states that represents a fever for her.  She will then develop vomiting and diarrhea.  The symptoms will last 2 to 3 days and then subside.  However she states that she keeps getting episodes of this that have occurred numerous times over the last several months.  On July 8 she spoke with my partner and at that time had a COVID test that was negative.  She continues to repeat that everybody gets this but they get better.  However I do not get better.  I do not think I am going to make it.  When I question the patient as to exactly what symptoms she is having today she reports a "lumpy stomach".  She reports early satiety.  She states that she cannot tolerate food.  If she eats food she feels full and bloated quickly and has trouble breathing as soon as she eats.  She will  occasionally get some diarrhea.  However today she denies diarrhea.  She denies any nausea or vomiting.  She denies any cough or chest pain or shortness of breath.  She denies any fevers or chills.  As stated above the history is very tangential and hard to follow.  Symptoms seem to change.  Patient makes it sound like she has been sick continuously since March and then at other times it seems like episodes of sickness.  However the one unifying factor includes early satiety, poor appetite, fear to eat food, occasional nausea, occasional diarrhea, and occasional headache.  I recommended trying the patient on Reglan for nausea for possible gastroparesis due to the early satiety and then obtaining an x-ray of the abdomen to evaluate for an ileus or evidence of bowel obstruction or constipation.  Patient refused this.  I recommended having the patient come in for blood work.  Patient adamantly refused this due to fear of needles.  She states if they take any blood for me she will die.  She seems extremely anxious and almost delusional.  She refuses to comply with any of my recommendations making it very difficult to determine what exactly is causing her problems.  At this point I stated to the patient that I must lay  hands on her and evaluate her in order to have any ability to diagnose her problem.  Therefore the patient will be seen tomorrow at 10:00 in the office. No past medical history on file. 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     aspirin 81 MG tablet  Take 1 tablet (81 mg total) by mouth 2 (two) times daily after a meal. 60 tablet 1   B Complex Vitamins (VITAMIN-B COMPLEX PO) Take 1 tablet by mouth daily.     HYDROcodone-acetaminophen (NORCO) 5-325 MG tablet Take 1-2 tablets by mouth every 6 (six) hours as needed for moderate pain. 30 tablet 0   ibuprofen (ADVIL,MOTRIN) 400 MG tablet Take 1 tablet (400 mg total) by mouth every 6 (six) hours. 30 tablet 0   Lactobacillus (PROBIOTIC ACIDOPHILUS) CAPS Take 1 capsule by mouth daily.     vitamin C (ASCORBIC ACID) 250 MG tablet Take 250 mg by mouth daily.     Vitamin D, Ergocalciferol, (DRISDOL) 50000 units CAPS capsule Take 1 capsule (50,000 Units total) by mouth every 7 (seven) days. 12 capsule 1   vitamin E (VITAMIN E) 200 UNIT capsule Take 200 Units by mouth daily.     No current facility-administered medications on file prior to visit.    Allergies  Allergen Reactions   Clindamycin/Lincomycin Diarrhea   Penicillins    Social History   Socioeconomic History   Marital status: Married    Spouse name: Not on file   Number of children: Not on file   Years of education: Not on file   Highest education level: Not on file  Occupational History   Not on file  Social Needs   Financial resource strain: Not on file   Food insecurity    Worry: Not on file    Inability: Not on file   Transportation needs    Medical: Not on file    Non-medical: Not on file  Tobacco Use   Smoking status: Never Smoker   Smokeless tobacco: Never Used  Substance and Sexual Activity   Alcohol use: No   Drug use: No   Sexual activity: Not on file  Lifestyle   Physical activity    Days per week: Not on file    Minutes per session: Not on file   Stress: Not on file  Relationships   Social connections    Talks on phone: Not on file    Gets together: Not on file    Attends religious service: Not on file    Active member of club or organization: Not on file    Attends meetings of  clubs or organizations: Not on file    Relationship status: Not on file   Intimate partner violence    Fear of current or ex partner: Not on file    Emotionally abused: Not on file    Physically abused: Not on file    Forced sexual activity: Not on file  Other Topics Concern   Not on file  Social History Narrative   Not on file  Review of Systems  All other systems reviewed and are negative.      Objective:   Physical Exam   Physical exam could not be performed today as the patient was seen over the telephone however her symptoms did not represent a life-threatening illness furthermore she refused to go to the emergency room or an urgent care today     Assessment & Plan:  The encounter diagnosis was Early satiety. I am uncertain of exactly what is causing the patient's symptoms.  I explained to the patient that I need to be able to lay hands on her and evaluate her and examine her to have an accurate assessment of what is going on to better determine the next steps in her diagnostic work-up.  My gut inclination suggest psychosomatic complaints coupled with IBS however it is possible the patient may have some type of obstruction, gastroparesis, or biliary dyskinesia.  Therefore I would like to physically examine the patient.  I will see the patient tomorrow at 10:00.  Patient is comfortable with this plan

## 2018-09-24 ENCOUNTER — Encounter: Payer: Self-pay | Admitting: Family Medicine

## 2018-09-24 ENCOUNTER — Ambulatory Visit (INDEPENDENT_AMBULATORY_CARE_PROVIDER_SITE_OTHER): Payer: Medicare Other | Admitting: Family Medicine

## 2018-09-24 VITALS — BP 126/80 | HR 104 | Temp 98.6°F | Resp 14 | Ht 64.0 in | Wt 108.0 lb

## 2018-09-24 DIAGNOSIS — R6889 Other general symptoms and signs: Secondary | ICD-10-CM | POA: Diagnosis not present

## 2018-09-24 DIAGNOSIS — R5382 Chronic fatigue, unspecified: Secondary | ICD-10-CM

## 2018-09-24 NOTE — Progress Notes (Signed)
Subjective:    Patient ID: Christina Chase, female    DOB: [birth year], 73 y.o.   MRN: [id number]  HPI Please see the office visit from yesterday.  Patient is here today at my request.  She has a very difficult history to follow.  She demonstrates tangential speech.  Despite frequent interruptions and redirections, I am unable to have the patient specify exactly what symptoms she is having.  She constantly refers back to a transfusion that she experienced last year that caused, "lumps" in her abdomen immediately after she had the transfusion.  She attributes all of her symptoms to this.  She believes that this may have weakened her immune system.  She reports having a flulike illness since that time.  Most recently it started in March after her husband had similar symptoms.  Her symptoms primarily involve a bandlike headache that wraps around her head that lasts 2 days.  She also reports nausea at times at other times no nausea but early satiety and lack of appetite.  She also have diarrhea that is mild for a few days.  Symptoms will then abate.  The symptoms then may recur a few days later.  She states this at times and then other times when restate her symptoms to verify that I am hearing her properly she refuses those symptoms.  I cannot emphasize enough how difficult the history is to obtain.  She does report fatigue.  She does seem to agree that she is having episodic headaches.  She continues to state that she is having the flu.  When I asked her to specify what she means by the flu in terms of symptoms she states that she is having exactly the symptoms or other family members have when they were sick.  However they were second March.  She denies body aches.  She denies rashes.  She denies vomiting.  She has not had diarrhea recently.  She refuses to eat because she is concerned that she will have a "reaction to the food".  She is losing weight.  She denies any chest pain shortness of breath cough  pleurisy or hemoptysis.  She denies any nausea or vomiting today  No past medical history on file. Past Surgical History:  Procedure Laterality Date  . 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 B Complex Vitamins (VITAMIN-B COMPLEX PO) Take 1 tablet by mouth daily.    . Calcium-Magnesium 100-50 MG TABS Take by mouth.    . Lactobacillus (PROBIOTIC ACIDOPHILUS) CAPS Take 1 capsule by mouth daily.    . vitamin C (ASCORBIC ACID) 250 MG tablet Take 250 mg by mouth daily.    . Vitamin D, Ergocalciferol, (DRISDOL) 50000 units CAPS capsule Take 1 capsule (50,000 Units total) by mouth every 7 (seven) days. 12 capsule 1  . vitamin E (VITAMIN E) 200 UNIT capsule Take 200 Units by mouth daily.     No current facility-administered medications on file prior to visit.    Allergies  Allergen Reactions  . Clindamycin/Lincomycin Diarrhea  . Penicillins    Social History   Socioeconomic History  . Marital status: Married    Spouse name: Not on file  . Number of children: Not on file  . Years of education: Not on file  . Highest  education level: Not on file  Occupational History  . Not on file  Social Needs  . Financial resource strain: Not on file  . Food insecurity    Worry: Not on file    Inability: Not on file  . Transportation needs    Medical: Not on file    Non-medical: Not on file  Tobacco Use  . Smoking status: Never Smoker  . Smokeless tobacco: Never Used  Substance and Sexual Activity  . Alcohol use: No  . Drug use: No  . Sexual activity: Not on file  Lifestyle  . Physical activity    Days per week: Not on file    Minutes per session: Not on file  . Stress: Not on file  Relationships  . Social Herbalist on phone: Not on file     Gets together: Not on file    Attends religious service: Not on file    Active member of club or organization: Not on file    Attends meetings of clubs or organizations: Not on file    Relationship status: Not on file  . Intimate partner violence    Fear of current or ex partner: Not on file    Emotionally abused: Not on file    Physically abused: Not on file    Forced sexual activity: Not on file  Other Topics Concern  . Not on file  Social History Narrative  . Not on file    Review of Systems  All other systems reviewed and are negative.      Objective:    Constitutional: She is oriented to person, place, and time. She appears well-developed and well-nourished. No distress.  HENT:  Head: Normocephalic and atraumatic.  Cardiovascular: Tachycardia, regular rhythm, normal heart sounds and intact distal pulses.  Exam reveals no gallop and no friction rub.   No murmur heard. Pulmonary/Chest: Effort normal and breath sounds normal. No stridor. No respiratory distress. She has no wheezes. She has no rales. She exhibits no tenderness.  Abdominal: Soft. Bowel sounds are normal. She exhibits no distension and no mass. There is no tenderness. There is no rebound and no guarding.  Neurological: She is alert and oriented to person, place, and time.  No cranial nerve deficit. She exhibits normal muscle tone. Coordination normal.  Skin: No rash noted. She is not diaphoretic.         Assessment & Plan:   1. Flu-like symptoms  2. Multiple complaints  - CBC with Differential/Platelet - COMPLETE METABOLIC PANEL WITH GFR - TSH - Sedimentation rate - Vitamin B12  3. Chronic fatigue - CBC with Differential/Platelet - COMPLETE METABOLIC PANEL WITH GFR - TSH - Sedimentation rate - Vitamin B12  Symptoms are vague.  They are difficult to pin down.  They are chronic and reoccurring.  Main symptom is lack of appetite, fatigue, episodic tension-like headache, and occasional diarrhea.  I  believe the majority of her symptoms are psychosomatic.  I will begin by obtaining basic lab work to evaluate for any underlying metabolic issues that may be at play.  I will check a CBC, CMP, TSH, sedimentation rate, and vitamin B12.  Further diagnostic work-up may be directed depending upon the results of these lab test.  Patient does appear to have protein calorie malnutrition.  This is the 1 abnormality seen on her physical exam

## 2018-09-25 ENCOUNTER — Telehealth: Payer: Self-pay | Admitting: Family Medicine

## 2018-09-25 LAB — CBC WITH DIFFERENTIAL/PLATELET
Absolute Monocytes: 378 cells/uL (ref 200–950)
Basophils Absolute: 9 cells/uL (ref 0–200)
Basophils Relative: 0.2 %
Eosinophils Absolute: 9 cells/uL — ABNORMAL LOW (ref 15–500)
Eosinophils Relative: 0.2 %
HCT: 45.1 % — ABNORMAL HIGH (ref 35.0–45.0)
Hemoglobin: 15.1 g/dL (ref 11.7–15.5)
Lymphs Abs: 1278 cells/uL (ref 850–3900)
MCH: 29.8 pg (ref 27.0–33.0)
MCHC: 33.5 g/dL (ref 32.0–36.0)
MCV: 89 fL (ref 80.0–100.0)
MPV: 11.8 fL (ref 7.5–12.5)
Monocytes Relative: 8.4 %
Neutro Abs: 2826 cells/uL (ref 1500–7800)
Neutrophils Relative %: 62.8 %
Platelets: 222 10*3/uL (ref 140–400)
RBC: 5.07 10*6/uL (ref 3.80–5.10)
RDW: 13.1 % (ref 11.0–15.0)
Total Lymphocyte: 28.4 %
WBC: 4.5 10*3/uL (ref 3.8–10.8)

## 2018-09-25 LAB — COMPLETE METABOLIC PANEL WITH GFR
AG Ratio: 2.3 (calc) (ref 1.0–2.5)
ALT: 21 U/L (ref 6–29)
AST: 23 U/L (ref 10–35)
Albumin: 5 g/dL (ref 3.6–5.1)
Alkaline phosphatase (APISO): 49 U/L (ref 37–153)
BUN/Creatinine Ratio: 11 (calc) (ref 6–22)
BUN: 6 mg/dL — ABNORMAL LOW (ref 7–25)
CO2: 25 mmol/L (ref 20–32)
Calcium: 10.9 mg/dL — ABNORMAL HIGH (ref 8.6–10.4)
Chloride: 98 mmol/L (ref 98–110)
Creat: 0.56 mg/dL — ABNORMAL LOW (ref 0.60–0.93)
GFR, Est African American: 106 mL/min/{1.73_m2} (ref >=60)
GFR, Est Non African American: 91 mL/min/{1.73_m2} (ref >=60)
Globulin: 2.2 g/dL (calc) (ref 1.9–3.7)
Glucose, Bld: 111 mg/dL — ABNORMAL HIGH (ref 65–99)
Potassium: 4.2 mmol/L (ref 3.5–5.3)
Sodium: 135 mmol/L (ref 135–146)
Total Bilirubin: 0.9 mg/dL (ref 0.2–1.2)
Total Protein: 7.2 g/dL (ref 6.1–8.1)

## 2018-09-25 LAB — VITAMIN B12: Vitamin B-12: 1648 pg/mL — ABNORMAL HIGH (ref 200–1100)

## 2018-09-25 LAB — TSH: TSH: 1.65 mIU/L (ref 0.40–4.50)

## 2018-09-25 LAB — SEDIMENTATION RATE: Sed Rate: 2 mm/h (ref 0–30)

## 2018-09-25 NOTE — Telephone Encounter (Signed)
Patient called in stating that she is experiencing numbness and tingling in her hands and feet with elevated heart rate. Patient sounds very anxious on the phone while telling me this and is speaking extremely fast to the point where it is very difficult for me to understand her. She then proceeds to tell me that she took and iron supplement this morning that was prescribed to her several years back that she never took until today. She informed me that she took the iron supplement because she gave 4 vials of blood in office that she states" is too much for me, I only weigh 100 lbs". In the background of the phone conversation I can hear patient's husband speaking and telling her that the [facility] says that she overdosed which is causing her symptoms. Patient then starts asking if she can take vitamin C to counteract the symptoms. Please advise?

## 2018-09-25 NOTE — Telephone Encounter (Signed)
Please clarify what exactly she took, if she took multiple doses of something,we need to call poison control if she is otherwise okay. Vitamin C is not going to do anything, it helps absorb iron  If she only took 1 iron pill she will be fine    Note She was in the office yesterday with Dr. Dennard Schaumann for evaluation, if her symptoms worse from yesterday needs to go to ER

## 2018-09-25 NOTE — Telephone Encounter (Signed)
Spoke with patient and informed her of recommendations per Dr. Buelah Manis. Patient verbalized understanding.

## 2018-09-26 NOTE — Telephone Encounter (Signed)
After a lengthy conversation of greater than 23 minutes patient informed me that she does not want to take any anxiety medication as she does not have anxiety. She also states she still believes it is related to taking to much iron. I advised patient to keep appointment tomorrow morning

## 2018-09-26 NOTE — Telephone Encounter (Signed)
Patient called back in after hours yesterday evening and left 3 voicemails in regards to her reaction to iron. She called stating that yesterday the symptoms resolved after taking benadryl but have returned and she would like to know what she can do to counteract the reaction. Informed patient again that it is unlikely that she had a reaction to the iron as she only took one. Patient states that she is certain that she is experiencing overdose symptoms. She also stated that she is depleted due to giving 4 vials of blood for her lab work the other day which is why she did not go to the ER because she was afraid they would take more blood. Patient requested office visit with you on tomorrow but would still like to know what she can do in the meantime to the reaction to the iron. Please advise?   Please see messages below from yesterday.

## 2018-09-26 NOTE — Telephone Encounter (Signed)
She is not having an overdose of iron after only 1 iron pill. This is not medically possible. Therefore, there is no medication that she needs to take for the reaction to the iron.  I feel many of her symptoms may be due to uncontrolled anxiety.  Would she like to try something for anxiety?

## 2018-09-27 ENCOUNTER — Ambulatory Visit (INDEPENDENT_AMBULATORY_CARE_PROVIDER_SITE_OTHER): Payer: Medicare Other | Admitting: Family Medicine

## 2018-09-27 ENCOUNTER — Other Ambulatory Visit: Payer: Self-pay

## 2018-09-27 VITALS — BP 150/88 | HR 122 | Temp 98.4°F | Resp 16 | Ht 64.0 in | Wt 107.0 lb

## 2018-09-27 DIAGNOSIS — F419 Anxiety disorder, unspecified: Secondary | ICD-10-CM | POA: Diagnosis not present

## 2018-09-27 NOTE — Progress Notes (Signed)
Subjective:    Patient ID: Christina Chase, female    DOB: 1945/07/29, 73 y.o.   MRN: [id number]  HPI Please see the office visit from yesterday.  Patient is here today at my request.  She has a very difficult history to follow.  She demonstrates tangential speech.  Despite frequent interruptions and redirections, I am unable to have the patient specify exactly what symptoms she is having.  She constantly refers back to a transfusion that she experienced last year that caused, "lumps" in her abdomen immediately after she had the transfusion.  She attributes all of her symptoms to this.  She believes that this may have weakened her immune system.  She reports having a flulike illness since that time.  Most recently it started in March after her husband had similar symptoms.  Her symptoms primarily involve a bandlike headache that wraps around her head that lasts 2 days.  She also reports nausea at times at other times no nausea but early satiety and lack of appetite.  She also have diarrhea that is mild for a few days.  Symptoms will then abate.  The symptoms then may recur a few days later.  She states this at times and then other times when restate her symptoms to verify that I am hearing her properly she refuses those symptoms.  I cannot emphasize enough how difficult the history is to obtain.  She does report fatigue.  She does seem to agree that she is having episodic headaches.  She continues to state that she is having the flu.  When I asked her to specify what she means by the flu in terms of symptoms she states that she is having exactly the symptoms or other family members have when they were sick.  However they were sick in March.  She denies body aches.  She denies rashes.  She denies vomiting.  She has not had diarrhea recently.  She refuses to eat because she is concerned that she will have a "reaction to the food".  She is losing weight.  She denies any chest pain shortness of breath cough  pleurisy or hemoptysis.  She denies any nausea or vomiting today.  At that time, my plan was: Symptoms are vague.  They are difficult to pin down.  They are chronic and reoccurring.  Main symptom is lack of appetite, fatigue, episodic tension-like headache, and occasional diarrhea.  I believe the majority of her symptoms are psychosomatic.  I will begin by obtaining basic lab work to evaluate for any underlying metabolic issues that may be at play.  I will check a CBC, CMP, TSH, sedimentation rate, and vitamin B12.  Further diagnostic work-up may be directed depending upon the results of these lab test.  Patient does appear to have protein calorie malnutrition.  This is the 1 abnormality seen on her physical exam  09/27/18 Office Visit on 09/24/2018  Component Date Value Ref Range Status   WBC 09/24/2018 4.5  3.8 - 10.8 Thousand/uL Final   RBC 09/24/2018 5.07  3.80 - 5.10 Million/uL Final   Hemoglobin 09/24/2018 15.1  11.7 - 15.5 g/dL Final   HCT 78/29/562107/21/2020 45.1* 35.0 - 45.0 % Final   MCV 09/24/2018 89.0  80.0 - 100.0 fL Final   MCH 09/24/2018 29.8  27.0 - 33.0 pg Final   MCHC 09/24/2018 33.5  32.0 - 36.0 g/dL Final   RDW 30/86/578407/21/2020 13.1  11.0 - 15.0 % Final   Platelets 09/24/2018 222  140 -  400 Thousand/uL Final   MPV 09/24/2018 11.8  7.5 - 12.5 fL Final   Neutro Abs 09/24/2018 2,826  1,500 - 7,800 cells/uL Final   Lymphs Abs 09/24/2018 1,278  850 - 3,900 cells/uL Final   Absolute Monocytes 09/24/2018 378  200 - 950 cells/uL Final   Eosinophils Absolute 09/24/2018 9* 15 - 500 cells/uL Final   Basophils Absolute 09/24/2018 9  0 - 200 cells/uL Final   Neutrophils Relative % 09/24/2018 62.8  % Final   Total Lymphocyte 09/24/2018 28.4  % Final   Monocytes Relative 09/24/2018 8.4  % Final   Eosinophils Relative 09/24/2018 0.2  % Final   Basophils Relative 09/24/2018 0.2  % Final   Glucose, Bld 09/24/2018 111* 65 - 99 mg/dL Final   Comment: .            Fasting reference  interval . For someone without known diabetes, a glucose value between 100 and 125 mg/dL is consistent with prediabetes and should be confirmed with a follow-up test. .    BUN 09/24/2018 6* 7 - 25 mg/dL Final   Creat 40/98/119107/21/2020 0.56* 0.60 - 0.93 mg/dL Final   Comment: For patients >75 years of age, the reference limit for Creatinine is approximately 13% higher for people identified as African-American. .    GFR, Est Non African American 09/24/2018 91  > OR = 60 mL/min/1.273m2 Final   GFR, Est African American 09/24/2018 106  > OR = 60 mL/min/1.3173m2 Final   BUN/Creatinine Ratio 09/24/2018 11  6 - 22 (calc) Final   Sodium 09/24/2018 135  135 - 146 mmol/L Final   Potassium 09/24/2018 4.2  3.5 - 5.3 mmol/L Final   Chloride 09/24/2018 98  98 - 110 mmol/L Final   CO2 09/24/2018 25  20 - 32 mmol/L Final   Calcium 09/24/2018 10.9* 8.6 - 10.4 mg/dL Final   Total Protein 47/82/956207/21/2020 7.2  6.1 - 8.1 g/dL Final   Albumin 13/08/657807/21/2020 5.0  3.6 - 5.1 g/dL Final   Globulin 46/96/295207/21/2020 2.2  1.9 - 3.7 g/dL (calc) Final   AG Ratio 09/24/2018 2.3  1.0 - 2.5 (calc) Final   Total Bilirubin 09/24/2018 0.9  0.2 - 1.2 mg/dL Final   Alkaline phosphatase (APISO) 09/24/2018 49  37 - 153 U/L Final   AST 09/24/2018 23  10 - 35 U/L Final   ALT 09/24/2018 21  6 - 29 U/L Final   TSH 09/24/2018 1.65  0.40 - 4.50 mIU/L Final   Sed Rate 09/24/2018 2  0 - 30 mm/h Final   Vitamin B-12 09/24/2018 1,648* 200 - 1,100 pg/mL Final   Aside from some mildly elevated calcium, the remainder of her lab work was completely normal.  However the patient felt so "wiped out" after having the blood drawn for her lab work that she took 1 iron tablet.  She then called back later that evening concerned that she was having symptoms of iron overload/iron toxicity after taking 1 iron tablet.  Despite our reassurances to her that this is not medically possible, patient was convinced that she was having side effects from  taking the iron.  This prompted us to schedule her a follow-up appointment today. Patient is convinced that she had an allergic reaction to the iron.  She states that after she took the iron, her left leg began to swell and turn red.  The vein stood out on her leg.  Her heart rate began to race.  She felt lightheaded.  She took some  Benadryl and felt better however when the Benadryl wore off the symptoms came back.  She refuses to take iron in the future.  She states that she is had similar reactions to antidepressants in the past as well as to garlic and other supplements.  I explained to the patient that I do not feel it was possible for her to have experienced an overdose on iron and also do not believe that this was an allergic reaction iron given the fact she had no swelling of the lips or tongue.  She experienced no hives.  She had no difficulty breathing.  I believe the reaction that she was experiencing was more likely a panic attack.  She declines that.  She does not feel that it was a panic attack  Now her history has changed.  She wrote down exactly what happened.  She states that over the last 9 days.  She has had intermittent headache followed by nausea followed by diarrhea.  She will have a headache for a day to 36 hours.  She will feel nauseated.  She will then have 3-4 episodes of diarrhea.  The symptoms will improve and then the 3-day cycle will repeat itself.  This is occurred 3 times over the last 9 days.  Each time the cycle improves.  Previously she stated that the cycle had occurred numerous times over the last 3 months.  The history seems very "fluid".  Blood pressure and heart rate were elevated on intake.  After sitting and talking for approximately 20 minutes, her heart rate come down to 72 bpm.  No past medical history on file. 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     Calcium-Magnesium 100-50 MG TABS Take by mouth.     Lactobacillus (PROBIOTIC ACIDOPHILUS) CAPS Take 1 capsule by mouth daily.     vitamin C (ASCORBIC ACID) 250 MG tablet Take 250 mg by mouth daily.     Vitamin D, Ergocalciferol, (DRISDOL) 50000 units CAPS capsule Take 1 capsule (50,000 Units total) by mouth every 7 (seven) days. 12 capsule 1   vitamin E (VITAMIN E) 200 UNIT capsule Take 200 Units by mouth daily.     No current facility-administered medications on file prior to visit.    Allergies  Allergen Reactions   Clindamycin/Lincomycin Diarrhea   Penicillins    Social History   Socioeconomic History   Marital status: Married    Spouse name: Not on file   Number of children: Not on file   Years of education: Not on file   Highest education level: Not on file  Occupational History   Not on file  Social Needs   Financial resource strain: Not on file   Food insecurity    Worry: Not on file    Inability: Not on file   Transportation needs    Medical: Not on file    Non-medical: Not on file  Tobacco Use   Smoking status: Never Smoker   Smokeless tobacco: Never Used  Substance and Sexual Activity   Alcohol use: No   Drug use: No   Sexual activity: Not on file  Lifestyle   Physical  activity    Days per week: Not on file    Minutes per session: Not on file   Stress: Not on file  Relationships   Social connections    Talks on phone: Not on file    Gets together: Not on file    Attends religious service: Not on file    Active member of club or organization: Not on file    Attends meetings of clubs or organizations: Not on file    Relationship status: Not on file   Intimate partner violence    Fear  of current or ex partner: Not on file    Emotionally abused: Not on file    Physically abused: Not on file    Forced sexual activity: Not on file  Other Topics Concern   Not on file  Social History Narrative   Not on file    Review of Systems  All other systems reviewed and are negative.      Objective:    Constitutional: She is oriented to person, place, and time. She appears well-developed and well-nourished. No distress.  HENT:  Head: Normocephalic and atraumatic.  Cardiovascular: Tachycardia, regular rhythm, normal heart sounds and intact distal pulses.  Exam reveals no gallop and no friction rub.   No murmur heard. Pulmonary/Chest: Effort normal and breath sounds normal. No stridor. No respiratory distress. She has no wheezes. She has no rales. She exhibits no tenderness.  Abdominal: Soft. Bowel sounds are normal. She exhibits no distension and no mass. There is no tenderness. There is no rebound and no guarding.  Neurological: She is alert and oriented to person, place, and time.  No cranial nerve deficit. She exhibits normal muscle tone. Coordination normal.  Skin: No rash noted. She is not diaphoretic.         Assessment & Plan:  The encounter diagnosis was Anxiety. I was frank with the patient.  I believe that she is experiencing physical symptoms.  I believe the headache the nausea and the diarrhea may be due to a viral gastroenteritis.  However I believe that her anxiety is likely exacerbating the symptoms and making them even worse than they otherwise would be.  I believe she had a panic attack when she took the iron 2 days ago.  I believe this is why she experienced the tachycardia and lightheadedness and out of body sensation.  I believe that the anxiety is likely exacerbating her physical symptoms.  However the patient believes that this is incorrect and she refuses any kind of medication for anxiety.  I ultimately ended our conversation stating that I simply wanted  to help her.  Her physical exam is normal today.  Her lab work was outstanding.  She clinically appears healthy.  Therefore I do not feel that there is any serious underlying medical illness at play.  I do believe that anxiety may be a contributing comorbidity however the patient does not have to take medication or seek treatment for this if she does not feel that is necessary.  I did make clear that I would put in her chart that the patient refuses iron in the future.

## 2018-09-30 ENCOUNTER — Telehealth: Payer: Self-pay | Admitting: Family Medicine

## 2018-09-30 ENCOUNTER — Encounter: Payer: Self-pay | Admitting: Family Medicine

## 2018-09-30 NOTE — Telephone Encounter (Signed)
Spoke with patient and informed her of recommendations of Dr.Pickard. Patient verbalized understanding and stated that she will upload a picture of the yogurt.

## 2018-09-30 NOTE — Telephone Encounter (Signed)
Patient called in stating that she was eating yogurt on Saturday and halfway through the container of yogurt "she hit a air pocket" and that's when she found what she believes to be fecal matter in her yogurt. She states that she called the [facility] and was told to call poison control. She called them and they informed her that they could not help with this situation so she called the hospital and spoke with a triage nurse. They informed her that if she developed diarrhea that it needed to run it course. She states diarrhea didn't start until yesterday she states it has been "bubbly". She has ate some oatmeal and drunk lots of water and diarrhea has gotten a little better. She would like to know what you recommend she do and whom she should contact to have the yogurt tested. Please advise

## 2018-09-30 NOTE — Telephone Encounter (Signed)
I do not know specifically where she can have the yogurt tested.  Can she send [place] a picture of the yogurt.  Meanwhile no treatment is necessary for the diarrhea.  This should run its course.  If she develops bloody diarrhea or fever she needs to contact us.

## 2018-10-31 ENCOUNTER — Other Ambulatory Visit: Payer: Self-pay

## 2018-10-31 DIAGNOSIS — R6889 Other general symptoms and signs: Secondary | ICD-10-CM | POA: Diagnosis not present

## 2018-10-31 DIAGNOSIS — Z20822 Contact with and (suspected) exposure to covid-19: Secondary | ICD-10-CM

## 2018-11-02 LAB — NOVEL CORONAVIRUS, NAA: SARS-CoV-2, NAA: NOT DETECTED

## 2018-11-07 ENCOUNTER — Telehealth: Payer: Self-pay | Admitting: Family Medicine

## 2018-11-07 NOTE — Telephone Encounter (Signed)
Please route to triage

## 2018-11-07 NOTE — Telephone Encounter (Signed)
Patient is now having flu like symptoms after being tested for covid, would like a call back asap to advise  [phone]

## 2018-11-07 NOTE — Telephone Encounter (Signed)
Spoke with patient and she stated that she is having the same flu like symptoms that she has had for over the past year that keep coming and going. She states that her and her husband were out and a couple of people sneezed on her and then she got sick. She started developing a headache, sore throat and low grade fever, upset stomach and diarrhea. States she understands that it needs to run it course but she does not understand how she keeps getting this over and over again. She also wanted to inquire about getting the flu shot since she has been immunocompromised for about a year if it is ok to get. I advised her that typically immunocompromised people are recommended to get the flu shot. Patient verbalized understanding.

## 2018-11-12 ENCOUNTER — Other Ambulatory Visit: Payer: Self-pay | Admitting: *Deleted

## 2018-11-12 DIAGNOSIS — Z20822 Contact with and (suspected) exposure to covid-19: Secondary | ICD-10-CM

## 2018-11-12 DIAGNOSIS — R6889 Other general symptoms and signs: Secondary | ICD-10-CM | POA: Diagnosis not present

## 2018-11-14 ENCOUNTER — Telehealth: Payer: Self-pay | Admitting: Family Medicine

## 2018-11-14 LAB — NOVEL CORONAVIRUS, NAA: SARS-CoV-2, NAA: NOT DETECTED

## 2018-11-14 NOTE — Telephone Encounter (Signed)
Tried to call pt no answer and no vm will send my chart message as to what symptoms she is having so that we can recommend treatment appropriately.

## 2018-11-14 NOTE — Telephone Encounter (Signed)
Patient is calling to say that she has a negative covid test however is still having flu like symptoms would like a call back with suggestions as to what she should do  [phone]

## 2018-11-15 NOTE — Telephone Encounter (Signed)
LMTRC

## 2018-11-21 ENCOUNTER — Other Ambulatory Visit: Payer: Self-pay

## 2018-11-21 ENCOUNTER — Ambulatory Visit (INDEPENDENT_AMBULATORY_CARE_PROVIDER_SITE_OTHER): Payer: Medicare Other | Admitting: Family Medicine

## 2018-11-21 ENCOUNTER — Encounter: Payer: Self-pay | Admitting: Family Medicine

## 2018-11-21 VITALS — BP 140/80 | HR 108 | Temp 98.7°F | Resp 14 | Ht 64.0 in | Wt 106.0 lb

## 2018-11-21 DIAGNOSIS — J321 Chronic frontal sinusitis: Secondary | ICD-10-CM | POA: Diagnosis not present

## 2018-11-21 DIAGNOSIS — Z23 Encounter for immunization: Secondary | ICD-10-CM | POA: Diagnosis not present

## 2018-11-21 NOTE — Progress Notes (Signed)
Subjective:    Patient ID: Christina Chase, female    DOB: [birth year], 73 y.o.   MRN: [id number]  HPI Please see the office visit from yesterday.  Patient is here today at my request.  She has a very difficult history to follow.  She demonstrates tangential speech.  Despite frequent interruptions and redirections, I am unable to have the patient specify exactly what symptoms she is having.  She constantly refers back to a transfusion that she experienced last year that caused, "lumps" in her abdomen immediately after she had the transfusion.  She attributes all of her symptoms to this.  She believes that this may have weakened her immune system.  She reports having a flulike illness since that time.  Most recently it started in March after her husband had similar symptoms.  Her symptoms primarily involve a bandlike headache that wraps around her head that lasts 2 days.  She also reports nausea at times at other times no nausea but early satiety and lack of appetite.  She also have diarrhea that is mild for a few days.  Symptoms will then abate.  The symptoms then may recur a few days later.  She states this at times and then other times when restate her symptoms to verify that I am hearing her properly she refuses those symptoms.  I cannot emphasize enough how difficult the history is to obtain.  She does report fatigue.  She does seem to agree that she is having episodic headaches.  She continues to state that she is having the flu.  When I asked her to specify what she means by the flu in terms of symptoms she states that she is having exactly the symptoms or other family members have when they were sick.  However they were sick in March.  She denies body aches.  She denies rashes.  She denies vomiting.  She has not had diarrhea recently.  She refuses to eat because she is concerned that she will have a "reaction to the food".  She is losing weight.  She denies any chest pain shortness of breath cough  pleurisy or hemoptysis.  She denies any nausea or vomiting today.  At that time, my plan was: Symptoms are vague.  They are difficult to pin down.  They are chronic and reoccurring.  Main symptom is lack of appetite, fatigue, episodic tension-like headache, and occasional diarrhea.  I believe the majority of her symptoms are psychosomatic.  I will begin by obtaining basic lab work to evaluate for any underlying metabolic issues that may be at play.  I will check a CBC, CMP, TSH, sedimentation rate, and vitamin B12.  Further diagnostic work-up may be directed depending upon the results of these lab test.  Patient does appear to have protein calorie malnutrition.  This is the 1 abnormality seen on her physical exam  09/27/18 No visits with results within 1 Week(s) from this visit.  Latest known visit with results is:  Orders Only on 11/12/2018  Component Date Value Ref Range Status   SARS-CoV-2, NAA 11/12/2018 Not Detected  Not Detected Final   Comment: This nucleic acid amplification test was developed and its performance characteristics determined by [facility]. Nucleic acid amplification tests include PCR and TMA. This test has not been FDA cleared or approved. This test has been authorized by FDA under an Emergency Use Authorization (EUA). This test is only authorized for the duration of time the declaration that circumstances exist justifying the authorization  of the emergency use of in vitro diagnostic tests for detection of SARS-CoV-2 virus and/or diagnosis of COVID-19 infection under section 564(b)(1) of the Act, 21 U.S.C. 426STM-1(D) (1), unless the authorization is terminated or revoked sooner. When diagnostic testing is negative, the possibility of a false negative result should be considered in the context of a patient's recent exposures and the presence of clinical signs and symptoms consistent with COVID-19. An individual without symptoms of COVID-19 and who is not  shedding SARS-CoV-2 virus would                           expect to have a negative (not detected) result in this assay.    Aside from some mildly elevated calcium, the remainder of her lab work was completely normal.  However the patient felt so "wiped out" after having the blood drawn for her lab work that she took 1 iron tablet.  She then called back later that evening concerned that she was having symptoms of iron overload/iron toxicity after taking 1 iron tablet.  Despite our reassurances to her that this is not medically possible, patient was convinced that she was having side effects from taking the iron.  This prompted [place] to schedule her a follow-up appointment today. Patient is convinced that she had an allergic reaction to the iron.  She states that after she took the iron, her left leg began to swell and turn red.  The vein stood out on her leg.  Her heart rate began to race.  She felt lightheaded.  She took some Benadryl and felt better however when the Benadryl wore off the symptoms came back.  She refuses to take iron in the future.  She states that she is had similar reactions to antidepressants in the past as well as to garlic and other supplements.  I explained to the patient that I do not feel it was possible for her to have experienced an overdose on iron and also do not believe that this was an allergic reaction iron given the fact she had no swelling of the lips or tongue.  She experienced no hives.  She had no difficulty breathing.  I believe the reaction that she was experiencing was more likely a panic attack.  She declines that.  She does not feel that it was a panic attack  Now her history has changed.  She wrote down exactly what happened.  She states that over the last 9 days.  She has had intermittent headache followed by nausea followed by diarrhea.  She will have a headache for a day to 36 hours.  She will feel nauseated.  She will then have 3-4 episodes of diarrhea.  The  symptoms will improve and then the 3-day cycle will repeat itself.  This is occurred 3 times over the last 9 days.  Each time the cycle improves.  Previously she stated that the cycle had occurred numerous times over the last 3 months.  The history seems very "fluid".  Blood pressure and heart rate were elevated on intake.  After sitting and talking for approximately 20 minutes, her heart rate come down to 72 bpm.  AT that time, my plan was: I was frank with the patient.  I believe that she is experiencing physical symptoms.  I believe the headache the nausea and the diarrhea may be due to a viral gastroenteritis.  However I believe that her anxiety is likely exacerbating the symptoms  and making them even worse than they otherwise would be.  I believe she had a panic attack when she took the iron 2 days ago.  I believe this is why she experienced the tachycardia and lightheadedness and out of body sensation.  I believe that the anxiety is likely exacerbating her physical symptoms.  However the patient believes that this is incorrect and she refuses any kind of medication for anxiety.  I ultimately ended our conversation stating that I simply wanted to help her.  Her physical exam is normal today.  Her lab work was outstanding.  She clinically appears healthy.  Therefore I do not feel that there is any serious underlying medical illness at play.  I do believe that anxiety may be a contributing comorbidity however the patient does not have to take medication or seek treatment for this if she does not feel that is necessary.  I did make clear that I would put in her chart that the patient refuses iron in the future.   11/21/18 Once again this was a very difficult encounter.  She states that she feels the same.  She states that she continues to have the same symptoms.  However she has a very difficult time putting into words exactly what those symptoms are.  She reports a recurrent constellation of symptoms which  include a headache that is diffuse pressure-like and seems to be in the frontal sinuses.  She would then developed postnasal drip and drainage and sinus congestion.  She will then develop perhaps a slight cough.  Usually she will have nausea but not vomiting.  Symptoms will last several days and then improve however they will never completely go away.  She states that in the last 2 months she is only had 2 or 3 days where she has not felt sick.  She also reports feeling extremely weak.  However the majority of her symptoms seem to be a dull mild headache that is recurrent at least every 3 to 4 days although she will not agree to that.  That is what her description describes.  She also reports postnasal drip and sinus drainage.  I question if this could be allergies and the patient states absolutely not.  She thinks that the symptoms are too severe to be allergies.  I then suggested that perhaps we should get a CT scan of the sinuses and the head to see if there is something structurally causing her symptoms to return but then she contradicts herself and states that the symptoms are not bad enough to warrant that.  She wants a flu shot to stop the symptoms from happening.  I explained that the flu shot would not stop the symptoms from happening however she then states that she wants to prevent getting sick because she does not think she could tolerate it if she were to get the flu.  She questions if her supplements may be causing her to feel sick and making her immune system hyperactive causing her to feel poorly.  I explained to the patient I did not feel that that was the case.  Her exam today aside from tachycardia is normal.  Patient however does appear anxious.  Her speaking pattern is very tangential and hard to follow.  No past medical history on file. 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     Calcium-Magnesium 100-50 MG TABS Take by mouth.     cholecalciferol (VITAMIN D3) 25 MCG (1000 UT) tablet Take 1,000 Units by mouth 4 (four) times daily.     Lactobacillus (PROBIOTIC ACIDOPHILUS) CAPS Take 1 capsule by mouth daily.     vitamin C (ASCORBIC ACID) 250 MG tablet Take 250 mg by mouth daily.     vitamin E (VITAMIN E) 200 UNIT capsule Take 200 Units by mouth daily.     No current facility-administered medications on file prior to visit.    Allergies  Allergen Reactions   Clindamycin/Lincomycin Diarrhea   Ferrous Sulfate     Panic attack like reaction   Penicillins    Social History   Socioeconomic History   Marital status: Married    Spouse name: Not on file   Number of children: Not on file   Years of education: Not on file   Highest education level: Not on file  Occupational History   Not on file  Social Needs   Financial resource strain: Not on file   Food insecurity    Worry: Not on file    Inability: Not on file   Transportation needs    Medical: Not on file    Non-medical: Not on file  Tobacco Use   Smoking status: Never Smoker   Smokeless tobacco: Never Used  Substance and Sexual Activity   Alcohol use: No   Drug use: No   Sexual activity: Not on file  Lifestyle   Physical activity    Days per week: Not on file    Minutes per session: Not on file   Stress: Not on file  Relationships   Social connections    Talks on phone: Not on file    Gets together: Not on file    Attends religious service: Not on file    Active member of club or organization: Not on file    Attends meetings of clubs or organizations: Not on file    Relationship status: Not on file   Intimate partner  violence    Fear of current or ex partner: Not on file    Emotionally abused: Not on file    Physically abused: Not on file    Forced sexual activity: Not on file  Other Topics Concern   Not on file  Social History Narrative   Not on file    Review of Systems  All other systems reviewed and are negative.      Objective:    Constitutional: She is oriented to person, place, and time. She appears well-developed and well-nourished. No distress.  HENT:  Head: Normocephalic and atraumatic.  Cardiovascular: Tachycardia, regular rhythm, normal heart sounds and intact distal pulses.  Exam reveals no gallop and no friction rub.   No murmur heard. Pulmonary/Chest: Effort normal and breath sounds normal. No stridor. No respiratory distress. She has no wheezes. She has no rales. She exhibits no tenderness.  Abdominal: Soft. Bowel sounds are normal. She exhibits no distension and no mass. There is no tenderness. There is no rebound and no guarding.  Neurological: She is alert and oriented to person, place, and time.  No cranial nerve deficit. She exhibits normal muscle tone. Coordination normal.  Skin: No rash noted. She is not diaphoretic.  Assessment & Plan:  Chronic frontal sinusitis  Aside from tachycardia, her exam is normal.  Her symptoms are very vague.  However they seem to be persistent now since at least May which will be 4 months.  Therefore I suspect either allergies or chronic sinusitis.  Patient seems dubious to this diagnosis.  Therefore I will schedule the patient for a CT scan of the sinuses to evaluate further.  However I do believe anxiety and other contributing psychological factors may be exacerbating her symptoms.

## 2018-11-21 NOTE — Addendum Note (Signed)
Addended by: Shary Decamp B on: 11/21/2018 03:48 PM   Modules accepted: Orders

## 2018-11-25 ENCOUNTER — Telehealth: Payer: Self-pay | Admitting: Family Medicine

## 2018-11-25 NOTE — Telephone Encounter (Signed)
Spoke with patient and advised her of Recommendations by Dr.Pickard. Patient verbalized understanding

## 2018-11-25 NOTE — Telephone Encounter (Signed)
Patient called in stating that since her influenza vaccine that she received on Thursday she has been having swelling in her nostrils, some bleeding from nose, and "stiffness in her throat")States that she is having some difficulty breathing out of her nose. Her CT Max/facial is not scheduled until next Monday the 28th. She would like to know if you believe it's from the vaccine. Also would like to know what you recommend for her symptoms. Please advise?

## 2018-11-25 NOTE — Telephone Encounter (Signed)
I do not feel the flu shot caused her symptoms.  I would try xyzal 5 mg poqday for her symptoms.  Helps with nasal congestion, post nasal drip.

## 2018-12-02 ENCOUNTER — Ambulatory Visit
Admission: RE | Admit: 2018-12-02 | Discharge: 2018-12-02 | Disposition: A | Discharge status: Home / Self Care | Payer: Medicare Other | Source: Ambulatory Visit | Attending: Family Medicine | Admitting: Family Medicine

## 2018-12-02 DIAGNOSIS — J329 Chronic sinusitis, unspecified: Secondary | ICD-10-CM | POA: Diagnosis not present

## 2018-12-02 DIAGNOSIS — J321 Chronic frontal sinusitis: Secondary | ICD-10-CM

## 2018-12-05 ENCOUNTER — Other Ambulatory Visit: Payer: Self-pay

## 2018-12-05 DIAGNOSIS — Z20822 Contact with and (suspected) exposure to covid-19: Secondary | ICD-10-CM

## 2018-12-06 LAB — NOVEL CORONAVIRUS, NAA: SARS-CoV-2, NAA: NOT DETECTED

## 2019-12-18 ENCOUNTER — Ambulatory Visit (INDEPENDENT_AMBULATORY_CARE_PROVIDER_SITE_OTHER): Payer: Medicare Other | Admitting: Family Medicine

## 2019-12-18 ENCOUNTER — Other Ambulatory Visit: Payer: Self-pay

## 2019-12-18 DIAGNOSIS — Z23 Encounter for immunization: Secondary | ICD-10-CM

## 2021-03-15 ENCOUNTER — Telehealth: Payer: Self-pay | Admitting: Physician Assistant

## 2021-03-15 ENCOUNTER — Telehealth: Payer: Self-pay

## 2021-03-15 DIAGNOSIS — U071 COVID-19: Secondary | ICD-10-CM

## 2021-03-15 MED ORDER — MOLNUPIRAVIR EUA 200MG CAPSULE: 4.0000 | ORAL_CAPSULE | Freq: Two times a day (BID) | ORAL | 0 refills | Status: AC

## 2021-03-15 NOTE — Telephone Encounter (Signed)
Patient called with positive covid results.  Advised patient to go to urgent care.  Also advised patient that she has not been seen in 3 years and would need to re-establish care.

## 2021-03-15 NOTE — Progress Notes (Signed)
Virtual Visit Consent   Christina Chase, you are scheduled for a virtual visit with a [facility] provider today.     Just as with appointments in the office, your consent must be obtained to participate.  Your consent will be active for this visit and any virtual visit you may have with one of our providers in the next 365 days.     If you have a MyChart account, a copy of this consent can be sent to you electronically.  All virtual visits are billed to your insurance company just like a traditional visit in the office.    As this is a virtual visit, video technology does not allow for your provider to perform a traditional examination.  This may limit your provider's ability to fully assess your condition.  If your provider identifies any concerns that need to be evaluated in person or the need to arrange testing (such as labs, EKG, etc.), we will make arrangements to do so.     Although advances in technology are sophisticated, we cannot ensure that it will always work on either your end or our end.  If the connection with a video visit is poor, the visit may have to be switched to a telephone visit.  With either a video or telephone visit, we are not always able to ensure that we have a secure connection.     I need to obtain your verbal consent now.   Are you willing to proceed with your visit today?    Christina Chase has provided verbal consent on 03/15/2021 for a virtual visit (video or telephone).   Christina Chase, [place]   Date: 03/15/2021 11:30 AM   Virtual Visit via Video Note   I, Christina Chase, connected with  Christina Chase  ([id number], 1945-10-17) on 03/15/21 at 11:15 AM EST by a video-enabled telemedicine application and verified that I am speaking with the correct person using two identifiers.  Location: Patient: Virtual Visit Location Patient: Home Provider: Virtual Visit Location Provider: Home Office   I discussed the limitations of evaluation and management  by telemedicine and the availability of in person appointments. The patient expressed understanding and agreed to proceed.    History of Present Illness: Christina Chase is a 76 y.o. who identifies as a female who was assigned female at birth, and is being seen today for COVID-19. Notes symptoms starting 2 days ago with head/nasal congestion with low-grade fever and one episode of non-bloody emesis. Notes some slight abdominal cramping. Denies chest congestion, chest pain or SOB. Notes COVID 3 years ago but no recurrent bouts since then. Took a home COVID test on day of symptom onset.Marland Kitchen   HPI: HPI  Problems:  Patient Active Problem List   Diagnosis Date Noted   Closed left hip fracture, initial encounter (HCC) 10/05/2017   Displaced fracture of left femoral neck (HCC)     Allergies:  Allergies  Allergen Reactions   Clindamycin/Lincomycin Diarrhea   Ferrous Sulfate     Panic attack like reaction   Penicillins    Medications:  Current Outpatient Medications:    molnupiravir EUA (LAGEVRIO) 200 mg CAPS capsule, Take 4 capsules (800 mg total) by mouth 2 (two) times daily for 5 days., Disp: 40 capsule, Rfl: 0   B Complex Vitamins (VITAMIN-B COMPLEX PO), Take 1 tablet by mouth daily., Disp: , Rfl:    Calcium-Magnesium 100-50 MG TABS, Take by mouth., Disp: , Rfl:    cholecalciferol (VITAMIN D3) 25 MCG (1000  UT) tablet, Take 1,000 Units by mouth 4 (four) times daily., Disp: , Rfl:    Lactobacillus (PROBIOTIC ACIDOPHILUS) CAPS, Take 1 capsule by mouth daily., Disp: , Rfl:    vitamin C (ASCORBIC ACID) 250 MG tablet, Take 250 mg by mouth daily., Disp: , Rfl:    vitamin E (VITAMIN E) 200 UNIT capsule, Take 200 Units by mouth daily., Disp: , Rfl:   Observations/Objective: Patient is well-developed, well-nourished in no acute distress.  Resting comfortably couch at home.  Head is normocephalic, atraumatic.  No labored breathing. Speech is clear and coherent with logical content.  Patient is alert  and oriented at baseline.   Assessment and Plan: 1. COVID-19 - molnupiravir EUA (LAGEVRIO) 200 mg CAPS capsule; Take 4 capsules (800 mg total) by mouth 2 (two) times daily for 5 days.  Dispense: 40 capsule; Refill: 0  Patient with multiple risk factors for complicated course of illness. Discussed risks/benefits of antiviral medications including most common potential ADRs. Patient voiced understanding and would like to proceed with antiviral medication. They are candidate for molnupiravir. Rx sent to pharmacy. Supportive measures, OTC medications and vitamin regimen reviewed. Patient has been enrolled in a MyChart COVID symptom monitoring program. Anne Shutter reviewed in detail. Strict ER precautions discussed with patient.    Follow Up Instructions: I discussed the assessment and treatment plan with the patient. The patient was provided an opportunity to ask questions and all were answered. The patient agreed with the plan and demonstrated an understanding of the instructions.  A copy of instructions were sent to the patient via MyChart unless otherwise noted below.   The patient was advised to call back or seek an in-person evaluation if the symptoms worsen or if the condition fails to improve as anticipated.  Time:  I spent 12 minutes with the patient via telehealth technology discussing the above problems/concerns.    Christina Climes, PA-C

## 2021-03-15 NOTE — Patient Instructions (Signed)
Christina Chase, thank you for joining Piedad Climes, PA-C for today's virtual visit.  While this provider is not your primary care provider (PCP), if your PCP is located in our provider database this encounter information will be shared with them immediately following your visit.  Consent: (Patient) Christina Chase provided verbal consent for this virtual visit at the beginning of the encounter.  Current Medications:  Current Outpatient Medications:    B Complex Vitamins (VITAMIN-B COMPLEX PO), Take 1 tablet by mouth daily., Disp: , Rfl:    Calcium-Magnesium 100-50 MG TABS, Take by mouth., Disp: , Rfl:    cholecalciferol (VITAMIN D3) 25 MCG (1000 UT) tablet, Take 1,000 Units by mouth 4 (four) times daily., Disp: , Rfl:    Lactobacillus (PROBIOTIC ACIDOPHILUS) CAPS, Take 1 capsule by mouth daily., Disp: , Rfl:    vitamin C (ASCORBIC ACID) 250 MG tablet, Take 250 mg by mouth daily., Disp: , Rfl:    vitamin E (VITAMIN E) 200 UNIT capsule, Take 200 Units by mouth daily., Disp: , Rfl:    Medications ordered in this encounter:  No orders of the defined types were placed in this encounter.    *If you need refills on other medications prior to your next appointment, please contact your pharmacy*  Follow-Up: Call back or seek an in-person evaluation if the symptoms worsen or if the condition fails to improve as anticipated.  Other Instructions Please keep well-hydrated and get plenty of rest. Start a saline nasal rinse to flush out your nasal passages. You can use plain Mucinex to help thin congestion. If you have a humidifier, running in the bedroom at night. I want you to start OTC vitamin D3 1000 units daily, vitamin C 1000 mg daily, and a zinc supplement. Please take prescribed medications as directed.  You have been enrolled in a MyChart symptom monitoring program. Please answer these questions daily so we can keep track of how you are doing.  You were to quarantine for 5 days  from onset of your symptoms.  After day 5, if you have had no fever and you are feeling better, you can end quarantine but need to mask for an additional 5 days. After day 5 if you have a fever or are having significant symptoms, please quarantine for full 10 days.  If you note any worsening of symptoms, any significant shortness of breath or any chest pain, please seek ER evaluation ASAP.  Please do not delay care!  COVID-19: What to Do if You Are Sick If you test positive and are an older adult or someone who is at high risk of getting very sick from COVID-19, treatment may be available. Contact a healthcare provider right away after a positive test to determine if you are eligible, even if your symptoms are mild right now. You can also visit a Test to Treat location and, if eligible, receive a prescription from a provider. Don't delay: Treatment must be started within the first few days to be effective. If you have a fever, cough, or other symptoms, you might have COVID-19. Most people have mild illness and are able to recover at home. If you are sick: Keep track of your symptoms. If you have an emergency warning sign (including trouble breathing), call 911. Steps to help prevent the spread of COVID-19 if you are sick If you are sick with COVID-19 or think you might have COVID-19, follow the steps below to care for yourself and to help protect other people in your  home and community. Stay home except to get medical care Stay home. Most people with COVID-19 have mild illness and can recover at home without medical care. Do not leave your home, except to get medical care. Do not visit public areas and do not go to places where you are unable to wear a mask. Take care of yourself. Get rest and stay hydrated. Take over-the-counter medicines, such as acetaminophen, to help you feel better. Stay in touch with your doctor. Call before you get medical care. Be sure to get care if you have trouble  breathing, or have any other emergency warning signs, or if you think it is an emergency. Avoid public transportation, ride-sharing, or taxis if possible. Get tested If you have symptoms of COVID-19, get tested. While waiting for test results, stay away from others, including staying apart from those living in your household. Get tested as soon as possible after your symptoms start. Treatments may be available for people with COVID-19 who are at risk for becoming very sick. Don't delay: Treatment must be started early to be effective--some treatments must begin within 5 days of your first symptoms. Contact your healthcare provider right away if your test result is positive to determine if you are eligible. Self-tests are one of several options for testing for the virus that causes COVID-19 and may be more convenient than laboratory-based tests and point-of-care tests. Ask your healthcare provider or your local health department if you need help interpreting your test results. You can visit your state, tribal, local, and territorial health department's website to look for the latest local information on testing sites. Separate yourself from other people As much as possible, stay in a specific room and away from other people and pets in your home. If possible, you should use a separate bathroom. If you need to be around other people or animals in or outside of the home, wear a well-fitting mask. Tell your close contacts that they may have been exposed to COVID-19. An infected person can spread COVID-19 starting 48 hours (or 2 days) before the person has any symptoms or tests positive. By letting your close contacts know they may have been exposed to COVID-19, you are helping to protect everyone. See COVID-19 and Animals if you have questions about pets. If you are diagnosed with COVID-19, someone from the health department may call you. Answer the call to slow the spread. Monitor your symptoms Symptoms of  COVID-19 include fever, cough, or other symptoms. Follow care instructions from your healthcare provider and local health department. Your local health authorities may give instructions on checking your symptoms and reporting information. When to seek emergency medical attention Look for emergency warning signs* for COVID-19. If someone is showing any of these signs, seek emergency medical care immediately: Trouble breathing Persistent pain or pressure in the chest New confusion Inability to wake or stay awake Pale, gray, or blue-colored skin, lips, or nail beds, depending on skin tone *This list is not all possible symptoms. Please call your medical provider for any other symptoms that are severe or concerning to you. Call 911 or call ahead to your local emergency facility: Notify the operator that you are seeking care for someone who has or may have COVID-19. Call ahead before visiting your doctor Call ahead. Many medical visits for routine care are being postponed or done by phone or telemedicine. If you have a medical appointment that cannot be postponed, call your doctor's office, and tell them you have or may  have COVID-19. This will help the office protect themselves and other patients. If you are sick, wear a well-fitting mask You should wear a mask if you must be around other people or animals, including pets (even at home). Wear a mask with the best fit, protection, and comfort for you. You don't need to wear the mask if you are alone. If you can't put on a mask (because of trouble breathing, for example), cover your coughs and sneezes in some other way. Try to stay at least 6 feet away from other people. This will help protect the people around you. Masks should not be placed on young children under age 50 years, anyone who has trouble breathing, or anyone who is not able to remove the mask without help. Cover your coughs and sneezes Cover your mouth and nose with a tissue when you  cough or sneeze. Throw away used tissues in a lined trash can. Immediately wash your hands with soap and water for at least 20 seconds. If soap and water are not available, clean your hands with an alcohol-based hand sanitizer that contains at least 60% alcohol. Clean your hands often Wash your hands often with soap and water for at least 20 seconds. This is especially important after blowing your nose, coughing, or sneezing; going to the bathroom; and before eating or preparing food. Use hand sanitizer if soap and water are not available. Use an alcohol-based hand sanitizer with at least 60% alcohol, covering all surfaces of your hands and rubbing them together until they feel dry. Soap and water are the best option, especially if hands are visibly dirty. Avoid touching your eyes, nose, and mouth with unwashed hands. Handwashing Tips Avoid sharing personal household items Do not share dishes, drinking glasses, cups, eating utensils, towels, or bedding with other people in your home. Wash these items thoroughly after using them with soap and water or put in the dishwasher. Clean surfaces in your home regularly Clean and disinfect high-touch surfaces (for example, doorknobs, tables, handles, light switches, and countertops) in your "sick room" and bathroom. In shared spaces, you should clean and disinfect surfaces and items after each use by the person who is ill. If you are sick and cannot clean, a caregiver or other person should only clean and disinfect the area around you (such as your bedroom and bathroom) on an as needed basis. Your caregiver/other person should wait as long as possible (at least several hours) and wear a mask before entering, cleaning, and disinfecting shared spaces that you use. Clean and disinfect areas that may have blood, stool, or body fluids on them. Use household cleaners and disinfectants. Clean visible dirty surfaces with household cleaners containing soap or  detergent. Then, use a household disinfectant. Use a product from [employer] List N: Disinfectants for Coronavirus (WGNFA-21). Be sure to follow the instructions on the label to ensure safe and effective use of the product. Many products recommend keeping the surface wet with a disinfectant for a certain period of time (look at "contact time" on the product label). You may also need to wear personal protective equipment, such as gloves, depending on the directions on the product label. Immediately after disinfecting, wash your hands with soap and water for 20 seconds. For completed guidance on cleaning and disinfecting your home, visit Complete Disinfection Guidance. Take steps to improve ventilation at home Improve ventilation (air flow) at home to help prevent from spreading COVID-19 to other people in your household. Clear out COVID-19 virus particles  in the air by opening windows, using air filters, and turning on fans in your home. Use this interactive tool to learn how to improve air flow in your home. When you can be around others after being sick with COVID-19 Deciding when you can be around others is different for different situations. Find out when you can safely end home isolation. For any additional questions about your care, contact your healthcare provider or state or local health department. 05/25/2020 Content source: [facility] ([facility]), Division of Viral Diseases This information is not intended to replace advice given to you by your health care provider. Make sure you discuss any questions you have with your health care provider. Document Revised: 07/08/2020 Document Reviewed: 07/08/2020 Elsevier Patient Education  2022 [employer].      If you have been instructed to have an in-person evaluation today at a local Urgent Care facility, please use the link below. It will take you to a list of all of our available [facility], including address, phone number and hours of operation. Please do not delay care.  Short Urgent Cares  If you or a family member do not have a primary care provider, use the link below to schedule a visit and establish care. When you choose a [facility] primary care physician or advanced practice provider, you gain a long-term partner in health. Find a Primary Care Provider  Learn more about [facility] in-office and virtual care options: [facility]

## 2021-03-16 ENCOUNTER — Telehealth: Payer: Self-pay

## 2021-03-16 NOTE — Telephone Encounter (Signed)
Pt.'s COVID 19 questionnaire triggered a BPA. Reports she has weakness and decreased appetite. States she is drinking well and eating chicken broth "and things like that." "I don't really feel worse today." States if she feels worse will do another virtual visit. Encouraged to continue to answer questionnaires. Verbalizes understanding.

## 2021-03-16 NOTE — Addendum Note (Signed)
Addended by: Waldon Merl on: 03/16/2021 01:19 PM   Modules accepted: Orders

## 2021-03-24 ENCOUNTER — Telehealth: Payer: Self-pay | Admitting: *Deleted

## 2021-03-24 NOTE — Telephone Encounter (Signed)
Call to patient- reports worsening SOB on COVID MyChart- Companion  Patient reports she is having fatigue. Patient reports no SOB when sitting or moving. Patient feels she is not getting a deep breath when breathing in. Patient states she has lose congestion and deep congestion in her throat- but not in chest. Sputum is clear. Patient feels that her throat is irritated and dry. Patient is drinking fluids- broth and water. Patient is using vitamin C and raw onion. No OTC medications.  Patient denies SOB when sitting, gasping for air, wheezing. Patient states she has more dryness in her throat. Advised saline nasal spray and salt water gargle to help lubricate sinuses. Patient advised reach out to PCP for follow up.

## 2021-03-27 ENCOUNTER — Telehealth: Payer: Self-pay

## 2021-03-27 ENCOUNTER — Encounter (INDEPENDENT_AMBULATORY_CARE_PROVIDER_SITE_OTHER): Payer: Self-pay

## 2021-03-27 NOTE — Telephone Encounter (Signed)
Congestion worsened this am upon awakening.coughing up white with a little bit of yellow. Inside of throat feels like a blockage. Breathing difficulty. Nasal congestion with little drainage. Frequent cough. Pt coughing up white with small amount of yellow. Pt stated she is SOB at rest and is anxious. Pt stated she feels her airway is swollen. Pt stated the swelling is worse than it was 2 hours ago. Advised pt and spouse to call 911 for evaluation.   Other advice:   Shortness of breath is the same:   continue to monitor at home  If symptoms become severe, i.e. shortness of breath at rest, gasping for air, wheezing, call 911 and seek treatment in the ED If cough remains the same or better: continue to treat with over the counter medications.  Hard candy or cough drops and drinking warm fluids. Adults can also use honey 2 tsp (10 ML) at bedtime.  If cough is becoming worse even with the use of over the counter medications and patient is not able to sleep at night, cough becomes productive with sputum that maybe yellow or green in color, contact PCP.  Routing to PCP.

## 2021-03-28 ENCOUNTER — Telehealth: Payer: Self-pay

## 2021-03-28 NOTE — Telephone Encounter (Signed)
COVID 19 questionnaire triggered BPA. Pt. Indicates new onset of diarrhea. States "it's not so bad.I had some problem breathing through my nose." Has had 1 loose stool today." States she is eating and drinking well. Eating chicken and vegetables. States she has an appointment with her PCP tomorrow.

## 2021-03-28 NOTE — Telephone Encounter (Signed)
Spoke with pt and she states she did call EMS and firemen and EMT's came out to assess her. Pt states they advised her airway was clear and her O2 was normal. Pt did not need transport to ED. Pt states she is feeling somewhat better. She believes her shob may be stress related. Pt was not shob while on phone and was able to have normal conversation. Pt scheduled OV for tomorrow and also advised if she starts feeling worse or has increased shob she can visit UC or ED for evaluation. Pt voiced understanding.

## 2021-03-29 ENCOUNTER — Other Ambulatory Visit: Payer: Self-pay

## 2021-03-29 ENCOUNTER — Ambulatory Visit (INDEPENDENT_AMBULATORY_CARE_PROVIDER_SITE_OTHER): Payer: Medicare Other | Admitting: Family Medicine

## 2021-03-29 ENCOUNTER — Encounter: Payer: Self-pay | Admitting: Family Medicine

## 2021-03-29 VITALS — BP 142/82 | HR 95 | Temp 97.1°F | Resp 18 | Ht 64.0 in | Wt 106.0 lb

## 2021-03-29 DIAGNOSIS — U071 COVID-19: Secondary | ICD-10-CM

## 2021-03-29 NOTE — Progress Notes (Signed)
Subjective:    Patient ID: Christina Chase, female    DOB: 05-14-1945, 76 y.o.   MRN: [id number]  HPI  Patient is a 76 year old Caucasian female who tested positive for COVID on January 10.  She declined to take antiviral therapy.  I was contacted yesterday by nurse from the hospital stating patient felt like her throat was swelling.  Apparently she called 911 and EMS evaluated her at her home.  She states that she felt like her throat was closing.  However "they checked her out and everything was fine".  She then took a hot shower and proceeded to cough up a substantial amount of mucus and after that has felt much better.  She denies any chest pain or pleurisy or hemoptysis or fever or chills.  She continues to have drainage.  She declines to take any type of decongestant such as Coricidin Sudafed because it causes her to have " a rebound effect." She tries to avoid any medications due to fear of side effects.  Today she is afebrile maintaining oxygen saturations.  There is no increased work of breathing.  Her lungs are clear to auscultation bilaterally.  There is no stridor.  There is no evidence of any swelling in the throat.  She has normal voice without any hoarseness or trismus.  She denies any sore throat. History reviewed. No pertinent past medical history. 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     cholecalciferol (VITAMIN D3) 25 MCG (1000 UT) tablet Take 1,000 Units by mouth 4 (four) times daily.     vitamin C (ASCORBIC ACID) 250 MG tablet Take 250 mg by mouth daily.     No current facility-administered medications on file prior to  visit.   Allergies  Allergen Reactions   Clindamycin/Lincomycin Diarrhea   Ferrous Sulfate     Panic attack like reaction   Penicillins    Social History   Socioeconomic History   Marital status: Married    Spouse name: Not on file   Number of children: Not on file   Years of education: Not on file   Highest education level: Not on file  Occupational History   Not on file  Tobacco Use   Smoking status: Never   Smokeless tobacco: Never  Substance and Sexual Activity   Alcohol use: No   Drug use: No   Sexual activity: Not on file  Other Topics Concern   Not on file  Social History Narrative   Not on file   Social Determinants of Health   Financial Resource Strain: Not on file  Food Insecurity: Not on file  Transportation Needs: Not on file  Physical Activity: Not on file  Stress: Not on file  Social Connections: Not on file  Intimate Partner Violence: Not on file     Review of Systems  All other systems reviewed and are negative.     Objective:   Physical Exam Vitals reviewed.  Constitutional:      General: She is not in acute distress.    Appearance: Normal appearance. She is not ill-appearing, toxic-appearing or diaphoretic.  HENT:     Right Ear: Tympanic  membrane and ear canal normal.     Left Ear: Tympanic membrane and ear canal normal.     Nose: Nose normal.     Mouth/Throat:     Mouth: Mucous membranes are moist.     Pharynx: No oropharyngeal exudate or posterior oropharyngeal erythema.  Eyes:     Conjunctiva/sclera: Conjunctivae normal.  Cardiovascular:     Rate and Rhythm: Normal rate and regular rhythm.     Heart sounds: Normal heart sounds. No murmur heard.   No friction rub. No gallop.  Pulmonary:     Effort: Pulmonary effort is normal. No respiratory distress.     Breath sounds: Normal breath sounds. No stridor. No wheezing, rhonchi or rales.  Musculoskeletal:     Cervical back: Neck supple. No rigidity or tenderness.  Lymphadenopathy:      Cervical: No cervical adenopathy.  Neurological:     Mental Status: She is alert.          Assessment & Plan:  COVID Patient's exam today is reassuring.  There is no abnormality seen on her exam.  I suggested trying Coricidin or Mucinex for postnasal drip and chest congestion.  Patient declines to take any medication.  We did discuss receiving a COVID booster.  At the present time, there is no evidence of any stridor, peritonsillar abscess, swelling in the throat, or difficulty breathing.  Follow-up as needed

## 2021-03-30 ENCOUNTER — Encounter: Payer: Self-pay | Admitting: Family Medicine

## 2021-04-06 ENCOUNTER — Encounter: Payer: Self-pay | Admitting: Family Medicine

## 2021-04-08 ENCOUNTER — Ambulatory Visit (INDEPENDENT_AMBULATORY_CARE_PROVIDER_SITE_OTHER): Payer: Medicare Other | Admitting: Family Medicine

## 2021-04-08 ENCOUNTER — Other Ambulatory Visit: Payer: Self-pay

## 2021-04-08 ENCOUNTER — Encounter: Payer: Self-pay | Admitting: Family Medicine

## 2021-04-08 VITALS — BP 148/88 | HR 97 | Temp 97.5°F | Resp 18 | Ht 64.0 in | Wt 106.0 lb

## 2021-04-08 DIAGNOSIS — R0989 Other specified symptoms and signs involving the circulatory and respiratory systems: Secondary | ICD-10-CM

## 2021-04-08 MED ORDER — FLUTICASONE PROPIONATE 50 MCG/ACT NA SUSP: 2.0000 | Freq: Every day | NASAL | 0 refills | Status: DC

## 2021-04-08 MED ORDER — BENZONATATE 200 MG PO CAPS: 200.0000 mg | ORAL_CAPSULE | Freq: Two times a day (BID) | ORAL | 0 refills | Status: DC | PRN

## 2021-04-08 NOTE — Progress Notes (Signed)
Subjective:    Christina Chase ID: Christina Chase, female    DOB: 09/24/1945, 76 y.o.   MRN: [id number]  HPI 03/29/21 Christina Chase is a 76 year old Caucasian female who tested positive for COVID on January 10.  Christina Chase declined to take antiviral therapy.  I was contacted yesterday by nurse from the hospital stating Christina Chase felt like her throat was swelling.  Apparently Christina Chase called 911 and EMS evaluated her at her home.  Christina Chase states that Christina Chase felt like her throat was closing.  However "they checked her out and everything was fine".  Christina Chase then took a hot shower and proceeded to cough up a substantial amount of mucus and after that has felt much better.  Christina Chase denies any chest pain or pleurisy or hemoptysis or fever or chills.  Christina Chase continues to have drainage.  Christina Chase declines to take any type of decongestant such as Coricidin Sudafed because it causes her to have " a rebound effect." Christina Chase tries to avoid any medications due to fear of side effects.  Today Christina Chase is afebrile maintaining oxygen saturations.  There is no increased work of breathing.  Her lungs are clear to auscultation bilaterally.  There is no stridor.  There is no evidence of any swelling in the throat.  Christina Chase has normal voice without any hoarseness or trismus.  Christina Chase denies any sore throat.  At that time, my plan was: Christina Chase's exam today is reassuring.  There is no abnormality seen on her exam.  I suggested trying Coricidin or Mucinex for postnasal drip and chest congestion.  Christina Chase declines to take any medication.  We did discuss receiving a COVID booster.  At the present time, there is no evidence of any stridor, peritonsillar abscess, swelling in the throat, or difficulty breathing.  Follow-up as needed  04/08/21 Christina Chase states that Saturday, Christina Chase had violent coughing spells.  Christina Chase was coughing extremely hard.  After that the following day Christina Chase felt like her throat was swollen.  Christina Chase felt difficult to swallow and difficulty breathing.  Christina Chase denies any hemoptysis.  Christina Chase denies  any chest pain.  Christina Chase denies any shortness of breath today.  Christina Chase states that Christina Chase has not cough since Saturday.  The swelling sensation in her throat has now subsided.  Christina Chase denies any headaches, fever, sinus pain, rhinorrhea, postnasal drip, sore throat.  Christina Chase is speaking without any labored breathing in full and complete sentences.  There is no palpable lymphadenopathy in the neck.  There is no palpable mass.  There is no stridor.  The Christina Chase is talking effortlessly.  Her lungs are completely clear to auscultation bilaterally.  I suspect the globus sensation possibly due to anxiety made worse by coughing and drainage History reviewed. No pertinent past medical history. 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     cholecalciferol (VITAMIN D3) 25 MCG (1000 UT) tablet Take 1,000 Units by mouth 4 (four) times daily.     vitamin C (ASCORBIC ACID) 250 MG tablet Take 250 mg by mouth daily.     No current facility-administered medications on file prior to visit.   Allergies  Allergen Reactions   Clindamycin/Lincomycin Diarrhea   Ferrous Sulfate  Panic attack like reaction   Penicillins    Social History   Socioeconomic History   Marital status: Married    Spouse name: Not on file   Number of children: Not on file   Years of education: Not on file   Highest education level: Not on file  Occupational History   Not on file  Tobacco Use   Smoking status: Never   Smokeless tobacco: Never  Substance and Sexual Activity   Alcohol use: No   Drug use: No   Sexual activity: Not on file  Other Topics Concern   Not on file  Social History  Narrative   Not on file   Social Determinants of Health   Financial Resource Strain: Not on file  Food Insecurity: Not on file  Transportation Needs: Not on file  Physical Activity: Not on file  Stress: Not on file  Social Connections: Not on file  Intimate Partner Violence: Not on file     Review of Systems  All other systems reviewed and are negative.     Objective:   Physical Exam Vitals reviewed.  Constitutional:      General: Christina Chase is not in acute distress.    Appearance: Normal appearance. Christina Chase is not ill-appearing, toxic-appearing or diaphoretic.  HENT:     Right Ear: Tympanic membrane and ear canal normal.     Left Ear: Tympanic membrane and ear canal normal.     Nose: Nose normal.     Mouth/Throat:     Mouth: Mucous membranes are moist.     Pharynx: No oropharyngeal exudate or posterior oropharyngeal erythema.  Eyes:     Conjunctiva/sclera: Conjunctivae normal.  Cardiovascular:     Rate and Rhythm: Normal rate and regular rhythm.     Heart sounds: Normal heart sounds. No murmur heard.   No friction rub. No gallop.  Pulmonary:     Effort: Pulmonary effort is normal. No respiratory distress.     Breath sounds: Normal breath sounds. No stridor. No wheezing, rhonchi or rales.  Musculoskeletal:     Cervical back: Neck supple. No rigidity or tenderness.  Lymphadenopathy:     Cervical: No cervical adenopathy.  Neurological:     Mental Status: Christina Chase is alert.          Assessment & Plan:  Globus sensation I reassured the Christina Chase today that her exam appears normal.  I do not see an indication for direct laryngoscopy.  I feel that most of this is likely anxiety related globus sensation made worse by coughing and drainage.  Therefore I will treat the Christina Chase symptomatically with Tessalon Perles 200 mg every 8 hours as needed for coughing.  Also recommended doing Flonase 2 sprays each nostril daily to help with postnasal drip and silent drainage which may be causing  subjective feeling of her throat closing.  I have recommended trying ibuprofen in case there is any swelling in her throat as an anti-inflammatory that Christina Chase states Christina Chase cannot tolerate any anti-inflammatory.

## 2021-04-18 ENCOUNTER — Telehealth: Payer: Self-pay | Admitting: Family Medicine

## 2021-04-18 NOTE — Telephone Encounter (Signed)
Patient left a voicemail message to report sx of flu in chest; just getting over COVID and requesting call back.  Please advise at [phone].

## 2021-04-19 NOTE — Telephone Encounter (Signed)
Spoke with pt and she states she is feeling better today. She reports coughing a lot yesterday and feeling like she has flu-like symptoms. She currently denies f/c/shob or other symptoms. Pt has not been tested for flu, does report several negative COVID tests in the past few weeks. Advised pt to continue her current medications, including OTC for her current symptoms. Pt advised if she develops f/c/shob she may need to seek medical attention. Pt voiced understanding. Nothing further needed at this time.

## 2021-04-20 NOTE — Telephone Encounter (Signed)
Patient called back to report lots of congestion, sore throat, and other flu sx this morning when she woke up; difficulty breathing this morning. Issues with top of lungs Took home remedies. Requesting call back with advice for managing sx. Poss repeat of same issue last year.   Please advise at [phone].

## 2021-04-20 NOTE — Telephone Encounter (Signed)
Spoke with patient and she reports she has increased cough today, some body aches when she woke up this morning. She states the congestion seems to be right under her collarbone. She also states she has been listening to her neck with a stethoscope and thinks she has stridor and congestion in her neck. I advised this was unlikely as she is breathing normally and able to have a full conversation without any s/s of shob or the need to cough (pt did not cough at all while on phone). Reassured patient that this is likely a viral illness, however if she is not improving we can certainly try to see her in clinic. Recommended she call back tomorrow if she is not feeling better. Patient voiced understanding. Also recommended hot steam, adding zinc and using cough drops to help with her symptoms. She will call us if she would like to be seen in the next few days. Nothing further needed at this time.

## 2021-04-29 ENCOUNTER — Ambulatory Visit (INDEPENDENT_AMBULATORY_CARE_PROVIDER_SITE_OTHER): Payer: Medicare Other

## 2021-04-29 ENCOUNTER — Other Ambulatory Visit: Payer: Self-pay

## 2021-04-29 VITALS — Ht 62.0 in | Wt 98.0 lb

## 2021-04-29 DIAGNOSIS — Z0000 Encounter for general adult medical examination without abnormal findings: Secondary | ICD-10-CM

## 2021-04-29 NOTE — Progress Notes (Signed)
Subjective:   Christina Jeffersonatricia Peto is a 76 y.o. female who presents for an Initial Medicare Annual Wellness Visit. Virtual Visit via Telephone Note  I connected with  Christina Chase on 04/29/21 at 11:15 AM EST by telephone and verified that I am speaking with the correct person using two identifiers.  Location: Patient: HOME Provider: [facility] Persons participating in the virtual visit: patient/Nurse Health Advisor   I discussed the limitations, risks, security and privacy concerns of performing an evaluation and management service by telephone and the availability of in person appointments. The patient expressed understanding and agreed to proceed.  Interactive audio and video telecommunications were attempted between this nurse and patient, however failed, due to patient having technical difficulties OR patient did not have access to video capability.  We continued and completed visit with audio only.  Some vital signs may be absent or patient reported.   Darral DashMary Jane K Ebony Yorio, LPN  Review of Systems     Cardiac Risk Factors include: advanced age (>3855men, 36>65 women);sedentary lifestyle     Objective:    Today's Vitals   04/29/21 1118  Weight: 98 lb (44.5 kg)  Height: 5\' 2"  (1.575 m)   Body mass index is 17.92 kg/m.  Advanced Directives 04/29/2021 10/05/2017  Does Patient Have a Medical Advance Directive? No No  Would patient like information on creating a medical advance directive? No - Patient declined Yes (Inpatient - patient defers creating a medical advance directive at this time)    Current Medications (verified) Outpatient Encounter Medications as of 04/29/2021  Medication Sig   B Complex Vitamins (VITAMIN-B COMPLEX PO) Take 1 tablet by mouth daily.   cholecalciferol (VITAMIN D3) 25 MCG (1000 UT) tablet Take 1,000 Units by mouth 4 (four) times daily.   fluticasone (FLONASE) 50 MCG/ACT nasal spray Place 2 sprays into both nostrils daily.   vitamin C (ASCORBIC ACID) 250 MG  tablet Take 250 mg by mouth daily.   benzonatate (TESSALON) 200 MG capsule Take 1 capsule (200 mg total) by mouth 2 (two) times daily as needed for cough. (Patient not taking: Reported on 04/29/2021)   No facility-administered encounter medications on file as of 04/29/2021.    Allergies (verified) Clindamycin/lincomycin, Ferrous sulfate, and Penicillins   History: No past medical history on file. 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   Procedure: TOTAL HIP ARTHROPLASTY ANTERIOR APPROACH;  Surgeon: Samson FredericSwinteck, Brian, MD;  Location: [facility] OR;  Service: Orthopedics;  Laterality: Left;   Family History  Problem Relation Age of Onset   Dementia Mother    Heart disease Mother    Heart disease Father    Hypertension Father    Cancer Maternal Grandmother        colon   Social History   Socioeconomic History   Marital status: Married    Spouse name: Peterson Lombarddmund   Number of children: 4   Years of education: Not on file   Highest education level: Not on file  Occupational History   Not on file  Tobacco Use   Smoking status: Never   Smokeless tobacco: Never  Substance and Sexual Activity   Alcohol use: No   Drug use: No   Sexual activity: Not on file  Other Topics Concern   Not on file  Social History Narrative   4 children    12 grandchildren.   Social Determinants of Health   Financial Resource Strain: Low Risk    Difficulty of Paying Living  Expenses: Not hard at all  Food Insecurity: No Food Insecurity   Worried About Programme researcher, broadcasting/film/video in the Last Year: Never true   Ran Out of Food in the Last Year: Never true  Transportation Needs: No Transportation Needs   Lack of Transportation (Medical): No   Lack of Transportation (Non-Medical): No  Physical Activity: Sufficiently Active   Days of Exercise per Week: 5 days   Minutes of Exercise per Session: 30 min  Stress: No Stress Concern Present   Feeling of Stress : Not at all   Social Connections: Socially Integrated   Frequency of Communication with Friends and Family: More than three times a week   Frequency of Social Gatherings with Friends and Family: More than three times a week   Attends Religious Services: 1 to 4 times per year   Active Member of [employer] or Organizations: No   Attends Engineer, structural: 1 to 4 times per year   Marital Status: Married    Tobacco Counseling Counseling given: Not Answered   Clinical Intake:  Pre-visit preparation completed: Yes  Pain : No/denies pain     BMI - recorded: 17.92 Nutritional Status: BMI <19  Underweight Nutritional Risks: None Diabetes: No  How often do you need to have someone help you when you read instructions, pamphlets, or other written materials from your doctor or pharmacy?: 1 - Never  Diabetic?No  Interpreter Needed?: No      Activities of Daily Living In your present state of health, do you have any difficulty performing the following activities: 04/29/2021  Hearing? N  Vision? N  Difficulty concentrating or making decisions? N  Walking or climbing stairs? N  Dressing or bathing? N  Doing errands, shopping? N  Preparing Food and eating ? N  Using the Toilet? N  In the past six months, have you accidently leaked urine? N  Do you have problems with loss of bowel control? N  Managing your Medications? N  Managing your Finances? N  Housekeeping or managing your Housekeeping? N  Some recent data might be hidden    Patient Care Team: Donita Brooks, MD as PCP - General (Family Medicine)  Indicate any recent Medical Services you may have received from other than [facility] providers in the past year (date may be approximate).     Assessment:   This is a routine wellness examination for Christina Chase.  Hearing/Vision screen Hearing Screening - Comments:: No hearing issues.  Vision Screening - Comments:: Glasses. Dr. Tenny Craw. 2020  Dietary issues and exercise activities  discussed: Current Exercise Habits: Home exercise routine, Type of exercise: walking, Time (Minutes): 30, Frequency (Times/Week): 5, Weekly Exercise (Minutes/Week): 150, Intensity: Mild, Exercise limited by: orthopedic condition(s)   Goals Addressed             This Visit's Progress    Exercise 3x per week (30 min per time)       Continue to exercise and stay healthy.       Depression Screen PHQ 2/9 Scores 04/29/2021  PHQ - 2 Score 0    Fall Risk Fall Risk  04/29/2021  Falls in the past year? 0  Number falls in past yr: 0  Injury with Fall? 0  Risk for fall due to : No Fall Risks  Follow up Falls prevention discussed    FALL RISK PREVENTION PERTAINING TO THE HOME:  Any stairs in or around the home? No  If so, are there any without handrails? No  Home free of loose throw rugs in walkways, pet beds, electrical cords, etc? No  Adequate lighting in your home to reduce risk of falls? No   ASSISTIVE DEVICES UTILIZED TO PREVENT FALLS:  Life alert? No  Use of a cane, walker or w/c? No  Grab bars in the bathroom? Yes  Shower chair or bench in shower? Yes  Elevated toilet seat or a handicapped toilet? Yes   TIMED UP AND GO:  Was the test performed? No .  Phone visit.  Cognitive Function:     6CIT Screen 04/29/2021  What Year? 0 points  What month? 0 points  What time? 0 points  Count back from 20 0 points  Months in reverse 0 points  Repeat phrase 0 points  Total Score 0    Immunizations Immunization History  Administered Date(s) Administered   Fluad Quad(high Dose 65+) 11/21/2018, 12/18/2019   Pneumococcal Polysaccharide-23 07/07/2016   Tdap 04/01/2012    TDAP status: Up to date  Flu Vaccine status: Declined, Education has been provided regarding the importance of this vaccine but patient still declined. Advised may receive this vaccine at local pharmacy or Health Dept. Aware to provide a copy of the vaccination record if obtained from local pharmacy or  Health Dept. Verbalized acceptance and understanding.  Pneumococcal vaccine status: Up to date  Covid-19 vaccine status: Declined, Education has been provided regarding the importance of this vaccine but patient still declined. Advised may receive this vaccine at local pharmacy or Health Dept.or vaccine clinic. Aware to provide a copy of the vaccination record if obtained from local pharmacy or Health Dept. Verbalized acceptance and understanding.  Qualifies for Shingles Vaccine? Yes   Zostavax completed No   Shingrix Completed?: No.    Education has been provided regarding the importance of this vaccine. Patient has been advised to call insurance company to determine out of pocket expense if they have not yet received this vaccine. Advised may also receive vaccine at local pharmacy or Health Dept. Verbalized acceptance and understanding.  Screening Tests Health Maintenance  Topic Date Due   COVID-19 Vaccine (1) Never done   Hepatitis C Screening  Never done   Zoster Vaccines- Shingrix (1 of 2) Never done   Pneumonia Vaccine 59+ Years old (2 - PCV) 07/07/2017   INFLUENZA VACCINE  10/04/2020   TETANUS/TDAP  04/01/2022   HPV VACCINES  Aged Out   DEXA SCAN  Discontinued    Health Maintenance  Health Maintenance Due  Topic Date Due   COVID-19 Vaccine (1) Never done   Hepatitis C Screening  Never done   Zoster Vaccines- Shingrix (1 of 2) Never done   Pneumonia Vaccine 32+ Years old (2 - PCV) 07/07/2017   INFLUENZA VACCINE  10/04/2020    Colorectal cancer screening: No longer required.   Mammogram status: No longer required due to age.  Bone Density status: Ordered PT DECLINED. Pt provided with contact info and advised to call to schedule appt.  Lung Cancer Screening: (Low Dose CT Chest recommended if Age 41-80 years, 30 pack-year currently smoking OR have quit w/in 15years.) does not qualify.   Additional Screening:  Hepatitis C Screening: does qualify; Completed DUE  Vision  Screening: Recommended annual ophthalmology exams for early detection of glaucoma and other disorders of the eye. Is the patient up to date with their annual eye exam?  No  Who is the provider or what is the name of the office in which the patient attends annual eye exams? Dr.  Scott If pt is not established with a provider, would they like to be referred to a provider to establish care? No .   Dental Screening: Recommended annual dental exams for proper oral hygiene  Community Resource Referral / Chronic Care Management: CRR required this visit?  No   CCM required this visit?  No      Plan:     I have personally reviewed and noted the following in the patients chart:   Medical and social history Use of alcohol, tobacco or illicit drugs  Current medications and supplements including opioid prescriptions. Patient is not currently taking opioid prescriptions. Functional ability and status Nutritional status Physical activity Advanced directives List of other physicians Hospitalizations, surgeries, and ER visits in previous 12 months Vitals Screenings to include cognitive, depression, and falls Referrals and appointments  In addition, I have reviewed and discussed with patient certain preventive protocols, quality metrics, and best practice recommendations. A written personalized care plan for preventive services as well as general preventive health recommendations were provided to patient.     Darral Dash, LPN   2/99/2426   Nurse Notes: Pt is up to date on age appropriate health maintenance. Pt declines vaccines.

## 2021-04-29 NOTE — Patient Instructions (Signed)
Christina Chase , Thank you for taking time to come for your Medicare Wellness Visit. I appreciate your ongoing commitment to your health goals. Please review the following plan we discussed and let me know if I can assist you in the future.   Screening recommendations/referrals: Colonoscopy: No longer required due to age.  Mammogram: No longer required due to age.  Bone Density: No longer required due to age.   Recommended yearly ophthalmology/optometry visit for glaucoma screening and checkup Recommended yearly dental visit for hygiene and checkup  Vaccinations: Influenza vaccine: Declined. Pneumococcal vaccine: Done 07/07/2016. Tdap vaccine: Done 04/01/2012 Repeat in 10 years  Shingles vaccine: Declined.   Covid-19:Declined.  Advanced directives: Please bring a copy of your health care power of attorney and living will to the office to be added to your chart at your convenience.   Conditions/risks identified: KEEP UP THE GOOD WORK!!  Next appointment: Follow up in one year for your annual wellness visit 2024.   Preventive Care 26 Years and Older, Female Preventive care refers to lifestyle choices and visits with your health care provider that can promote health and wellness. What does preventive care include? A yearly physical exam. This is also called an annual well check. Dental exams once or twice a year. Routine eye exams. Ask your health care provider how often you should have your eyes checked. Personal lifestyle choices, including: Daily care of your teeth and gums. Regular physical activity. Eating a healthy diet. Avoiding tobacco and drug use. Limiting alcohol use. Practicing safe sex. Taking low-dose aspirin every day. Taking vitamin and mineral supplements as recommended by your health care provider. What happens during an annual well check? The services and screenings done by your health care provider during your annual well check will depend on your age, overall  health, lifestyle risk factors, and family history of disease. Counseling  Your health care provider may ask you questions about your: Alcohol use. Tobacco use. Drug use. Emotional well-being. Home and relationship well-being. Sexual activity. Eating habits. History of falls. Memory and ability to understand (cognition). Work and work Statistician. Reproductive health. Screening  You may have the following tests or measurements: Height, weight, and BMI. Blood pressure. Lipid and cholesterol levels. These may be checked every 5 years, or more frequently if you are over 84 years old. Skin check. Lung cancer screening. You may have this screening every year starting at age 79 if you have a 30-pack-year history of smoking and currently smoke or have quit within the past 15 years. Fecal occult blood test (FOBT) of the stool. You may have this test every year starting at age 68. Flexible sigmoidoscopy or colonoscopy. You may have a sigmoidoscopy every 5 years or a colonoscopy every 10 years starting at age 58. Hepatitis C blood test. Hepatitis B blood test. Sexually transmitted disease (STD) testing. Diabetes screening. This is done by checking your blood sugar (glucose) after you have not eaten for a while (fasting). You may have this done every 1-3 years. Bone density scan. This is done to screen for osteoporosis. You may have this done starting at age 63. Mammogram. This may be done every 1-2 years. Talk to your health care provider about how often you should have regular mammograms. Talk with your health care provider about your test results, treatment options, and if necessary, the need for more tests. Vaccines  Your health care provider may recommend certain vaccines, such as: Influenza vaccine. This is recommended every year. Tetanus, diphtheria, and acellular pertussis (  Tdap, Td) vaccine. You may need a Td booster every 10 years. Zoster vaccine. You may need this after age  61. Pneumococcal 13-valent conjugate (PCV13) vaccine. One dose is recommended after age 56. Pneumococcal polysaccharide (PPSV23) vaccine. One dose is recommended after age 70. Talk to your health care provider about which screenings and vaccines you need and how often you need them. This information is not intended to replace advice given to you by your health care provider. Make sure you discuss any questions you have with your health care provider. Document Released: 03/19/2015 Document Revised: 11/10/2015 Document Reviewed: 12/22/2014 Elsevier Interactive Patient Education  2017 [place] Prevention in the Home Falls can cause injuries. They can happen to people of all ages. There are many things you can do to make your home safe and to help prevent falls. What can I do on the outside of my home? Regularly fix the edges of walkways and driveways and fix any cracks. Remove anything that might make you trip as you walk through a door, such as a raised step or threshold. Trim any bushes or trees on the path to your home. Use bright outdoor lighting. Clear any walking paths of anything that might make someone trip, such as rocks or tools. Regularly check to see if handrails are loose or broken. Make sure that both sides of any steps have handrails. Any raised decks and porches should have guardrails on the edges. Have any leaves, snow, or ice cleared regularly. Use sand or salt on walking paths during winter. Clean up any spills in your garage right away. This includes oil or grease spills. What can I do in the bathroom? Use night lights. Install grab bars by the toilet and in the tub and shower. Do not use towel bars as grab bars. Use non-skid mats or decals in the tub or shower. If you need to sit down in the shower, use a plastic, non-slip stool. Keep the floor dry. Clean up any water that spills on the floor as soon as it happens. Remove soap buildup in the tub or shower  regularly. Attach bath mats securely with double-sided non-slip rug tape. Do not have throw rugs and other things on the floor that can make you trip. What can I do in the bedroom? Use night lights. Make sure that you have a light by your bed that is easy to reach. Do not use any sheets or blankets that are too big for your bed. They should not hang down onto the floor. Have a firm chair that has side arms. You can use this for support while you get dressed. Do not have throw rugs and other things on the floor that can make you trip. What can I do in the kitchen? Clean up any spills right away. Avoid walking on wet floors. Keep items that you use a lot in easy-to-reach places. If you need to reach something above you, use a strong step stool that has a grab bar. Keep electrical cords out of the way. Do not use floor polish or wax that makes floors slippery. If you must use wax, use non-skid floor wax. Do not have throw rugs and other things on the floor that can make you trip. What can I do with my stairs? Do not leave any items on the stairs. Make sure that there are handrails on both sides of the stairs and use them. Fix handrails that are broken or loose. Make sure that handrails are  as long as the stairways. Check any carpeting to make sure that it is firmly attached to the stairs. Fix any carpet that is loose or worn. Avoid having throw rugs at the top or bottom of the stairs. If you do have throw rugs, attach them to the floor with carpet tape. Make sure that you have a light switch at the top of the stairs and the bottom of the stairs. If you do not have them, ask someone to add them for you. What else can I do to help prevent falls? Wear shoes that: Do not have high heels. Have rubber bottoms. Are comfortable and fit you well. Are closed at the toe. Do not wear sandals. If you use a stepladder: Make sure that it is fully opened. Do not climb a closed stepladder. Make sure that  both sides of the stepladder are locked into place. Ask someone to hold it for you, if possible. Clearly mark and make sure that you can see: Any grab bars or handrails. First and last steps. Where the edge of each step is. Use tools that help you move around (mobility aids) if they are needed. These include: Canes. Walkers. Scooters. Crutches. Turn on the lights when you go into a dark area. Replace any light bulbs as soon as they burn out. Set up your furniture so you have a clear path. Avoid moving your furniture around. If any of your floors are uneven, fix them. If there are any pets around you, be aware of where they are. Review your medicines with your doctor. Some medicines can make you feel dizzy. This can increase your chance of falling. Ask your doctor what other things that you can do to help prevent falls. This information is not intended to replace advice given to you by your health care provider. Make sure you discuss any questions you have with your health care provider. Document Released: 12/17/2008 Document Revised: 07/29/2015 Document Reviewed: 03/27/2014 Elsevier Interactive Patient Education  2017 [employer].

## 2021-05-01 ENCOUNTER — Encounter (HOSPITAL_BASED_OUTPATIENT_CLINIC_OR_DEPARTMENT_OTHER): Payer: Self-pay | Admitting: Emergency Medicine

## 2021-05-01 ENCOUNTER — Other Ambulatory Visit: Payer: Self-pay

## 2021-05-01 ENCOUNTER — Emergency Department (HOSPITAL_BASED_OUTPATIENT_CLINIC_OR_DEPARTMENT_OTHER)
Admission: EM | Admit: 2021-05-01 | Discharge: 2021-05-01 | Disposition: A | Discharge status: Home / Self Care | Payer: Medicare Other | Attending: Emergency Medicine | Admitting: Emergency Medicine

## 2021-05-01 DIAGNOSIS — R0989 Other specified symptoms and signs involving the circulatory and respiratory systems: Secondary | ICD-10-CM | POA: Insufficient documentation

## 2021-05-01 DIAGNOSIS — T7840XA Allergy, unspecified, initial encounter: Secondary | ICD-10-CM

## 2021-05-01 DIAGNOSIS — T484X5A Adverse effect of expectorants, initial encounter: Secondary | ICD-10-CM | POA: Insufficient documentation

## 2021-05-01 MED ORDER — FAMOTIDINE 20 MG PO TABS
20.0000 mg | ORAL_TABLET | Freq: Once | ORAL | Status: AC
  Administered 2021-05-01: 20 mg via ORAL
  Filled 2021-05-01: qty 1

## 2021-05-01 MED ORDER — EPINEPHRINE 0.3 MG/0.3ML IJ SOAJ: 0.3000 mg | INTRAMUSCULAR | 0 refills | Status: DC | PRN

## 2021-05-01 MED ORDER — PREDNISONE 50 MG PO TABS
60.0000 mg | ORAL_TABLET | Freq: Once | ORAL | Status: AC
  Administered 2021-05-01: 60 mg via ORAL
  Filled 2021-05-01: qty 1

## 2021-05-01 NOTE — ED Triage Notes (Signed)
Reports throat tightness 1 hour after taking Mucinex. Pt speaking full sentences , no respiratory distress, no tongue swelling .

## 2021-05-01 NOTE — ED Provider Notes (Signed)
[facility] EMERGENCY DEPT Provider Note   CSN: [id number] Arrival date & time: 05/01/21  1729     History  Chief Complaint  Patient presents with   Allergic Reaction    Christina Chase is a 76 y.o. female with no pertinent past medical history.  Presents to the emergency department with a chief complaint of sensation of throat closing.  Patient reports that today at approximately 3:45 PM she took Mucinex extended release.  Approximately 1 hour after taking this pill she gargled organic lemon juice.  Patient states that after gargling organic lemon juice she had "an explosion of foamy white mucus."  Patient states that after this her throat felt like it was closing up and felt scratchy.  Patient continues to express feeling of throat closing up and scratchiness.  Patient denies any hives, facial swelling, tongue swelling, trouble swallowing, drooling, trismus, hot potato voice, difficulty breathing, nausea, vomiting.  Patient does endorse diarrhea however states that this has been present since being diagnosed with flulike illness 2 weeks prior.   Allergic Reaction Presenting symptoms: no difficulty swallowing and no rash       Home Medications Prior to Admission medications   Medication Sig Start Date End Date Taking? Authorizing Provider  B Complex Vitamins (VITAMIN-B COMPLEX PO) Take 1 tablet by mouth daily.    [provider]  benzonatate (TESSALON) 200 MG capsule Take 1 capsule (200 mg total) by mouth 2 (two) times daily as needed for cough. Patient not taking: Reported on 04/29/2021 04/08/21   Donita Brooks, MD  cholecalciferol (VITAMIN D3) 25 MCG (1000 UT) tablet Take 1,000 Units by mouth 4 (four) times daily.    [provider]  fluticasone (FLONASE) 50 MCG/ACT nasal spray Place 2 sprays into both nostrils daily. 04/08/21   Donita Brooks, MD  vitamin C (ASCORBIC ACID) 250 MG tablet Take 250 mg by mouth daily.    [provider]       Allergies    Clindamycin/lincomycin, Ferrous sulfate, and Penicillins    Review of Systems   Review of Systems  Constitutional:  Negative for chills and fever.  HENT:  Negative for drooling, facial swelling, trouble swallowing and voice change.   Respiratory:  Positive for cough.   Gastrointestinal:  Positive for diarrhea. Negative for abdominal pain, nausea and vomiting.  Musculoskeletal:  Negative for neck pain and neck stiffness.  Skin:  Negative for rash.  Neurological:  Negative for syncope.  Psychiatric/Behavioral:  Negative for confusion.    Physical Exam Updated Vital Signs BP (!) 169/102    Pulse (!) 112    Temp 98 F (36.7 C)    Resp 20    SpO2 99%  Physical Exam Vitals and nursing note reviewed.  Constitutional:      General: She is not in acute distress.    Appearance: She is not ill-appearing, toxic-appearing or diaphoretic.  HENT:     Head: Normocephalic.     Jaw: No trismus, swelling, pain on movement or malocclusion.     Comments: No facial swelling    Mouth/Throat:     Lips: Pink. No lesions.     Mouth: Mucous membranes are moist.     Tongue: No lesions. Tongue does not deviate from midline.     Palate: No mass and lesions.     Pharynx: Oropharynx is clear. Uvula midline. No pharyngeal swelling, oropharyngeal exudate, posterior oropharyngeal erythema or uvula swelling.     Tonsils: No tonsillar exudate or tonsillar abscesses.  1+ on the right. 1+ on the left.     Comments: No tongue swelling.  Handles oral secretions without difficulty. Eyes:     General: No scleral icterus.       Right eye: No discharge.        Left eye: No discharge.  Cardiovascular:     Rate and Rhythm: Normal rate.  Pulmonary:     Effort: Pulmonary effort is normal. No tachypnea, bradypnea or respiratory distress.     Breath sounds: Normal breath sounds. No stridor.  Musculoskeletal:     Cervical back: Normal range of motion and neck supple.  Skin:    General: Skin is warm and  dry.     Findings: No rash. Rash is not urticarial.  Neurological:     General: No focal deficit present.     Mental Status: She is alert.  Psychiatric:        Behavior: Behavior is cooperative.    ED Results / Procedures / Treatments   Labs (all labs ordered are listed, but only abnormal results are displayed) Labs Reviewed - No data to display  EKG None  Radiology No results found.  Procedures Procedures    Medications Ordered in ED Medications  famotidine (PEPCID) tablet 20 mg (20 mg Oral Given 05/01/21 1852)  predniSONE (DELTASONE) tablet 60 mg (60 mg Oral Given 05/01/21 1852)    ED Course/ Medical Decision Making/ A&P                           Medical Decision Making Risk Prescription drug management.   Alert 76 year old female in no acute distress, nontoxic-appearing.  Presents emergency department with a chief complaint of feeling like her throat is closing.  Information is obtained from patient and patient's husband at bedside.  Past medical records reviewed reviewed including previous provider notes.  Per chart review patient was seen by PCP on 04/08/2021 for globus sensation which was attributed to anxiety made worse by coughing and drainage.  At that visit she had subjective feeling of her throat closing.  Patient is in no acute distress.  No adventitious lung sounds, stridor, facial swelling, drooling, rash/hives.  Patient denies any nausea or vomiting.  Patient speaks in full complete sentences without difficulty.  Administering epinephrine was considered however low suspicion for anaphylaxis at this time.  Due to patient's report of feeling like her throat was closing after taking Mucinex concern for possible allergic reaction.  Will administer prednisone as well as Pepcid at this time.  Patient was offered Benadryl however declines due to reported allergy.  We will plan to reassess patient.  On reassessment patient reports improvement in feeling like her  throat is closing however feels that her throat is dry.  Patient continues to have no signs of anaphylaxis, stridor, adventitious lung sounds, hives or facial swelling.  Patient continues speak in full complete sentences without difficulty.  It is unclear if patient had true allergic reaction however due to concern will prescribe patient with EpiPen.  Patient was offered to send EpiPen to 24-hour pharmacy however she declines at this time.  We will have patient follow-up closely with her primary care provider.  Patient advised to refrain from Mucinex use in the future.  Discussed results, findings, treatment and follow up. Patient advised of return precautions. Patient verbalized understanding and agreed with plan.  Patient care was discussed with attending physician Dr. Rush Landmark        Final Clinical  Impression(s) / ED Diagnoses Final diagnoses:  Allergic reaction, initial encounter    Rx / DC Orders ED Discharge Orders          Ordered    EPINEPHrine 0.3 mg/0.3 mL IJ SOAJ injection  As needed        05/01/21 2157              Haskel Schroeder, PA-C 05/02/21 [id number]    Tegeler, Canary Brim, MD 05/02/21 2023

## 2021-05-01 NOTE — ED Notes (Signed)
Patient presents to the ER after taking 600mg  Mucinex XR and then 1 hour later having lemon juice in her mouth and having a "Burst of mucous" coming up that she spit up for 5 minutes or more and made her briefly feel as if her throat was closing.   RN explained we could get some basic lab work based on her heart rate and patient reports she would prefer to wait until seen by the provider as she became anemic following having blood drawn at her hip surgery. Patient's airway is patent at this time and she is sating appropriately on room air.

## 2021-05-01 NOTE — ED Notes (Signed)
Rounded on pt. Pt currently resting in bed with husband at bedside. Denies airway difficulty. Breathing even and unlabored. No signs of distress. VSS and currently on monitor

## 2021-05-01 NOTE — Discharge Instructions (Addendum)
You came to the Emergency Department today to be evaluated after a potential allergic reaction.  You received medications in the emergency department to help with allergic reaction.  Your physical exam was reassuring.  While it is unclear if you had a allergic reaction we will give you a EpiPen to use in the case of anaphylaxis.  Please read attached paperwork on proper use of this.  Get help right away if: You have symptoms of anaphylaxis. These include: Swollen mouth, tongue, or throat. Pain or tightness in your chest. Trouble breathing or shortness of breath. Dizziness or fainting. Severe abdominal pain, vomiting, or diarrhea.

## 2021-05-03 ENCOUNTER — Other Ambulatory Visit: Payer: Self-pay

## 2021-05-03 ENCOUNTER — Ambulatory Visit (INDEPENDENT_AMBULATORY_CARE_PROVIDER_SITE_OTHER): Payer: Medicare Other | Admitting: Family Medicine

## 2021-05-03 VITALS — BP 124/62 | HR 93 | Temp 97.0°F | Resp 18 | Ht 62.0 in | Wt 98.0 lb

## 2021-05-03 DIAGNOSIS — R0989 Other specified symptoms and signs involving the circulatory and respiratory systems: Secondary | ICD-10-CM

## 2021-05-03 DIAGNOSIS — J385 Laryngeal spasm: Secondary | ICD-10-CM | POA: Diagnosis not present

## 2021-05-03 NOTE — Progress Notes (Signed)
Subjective:    Patient ID: Christina Chase, female    DOB: 17-Dec-1945, 76 y.o.   MRN: [id number]  HPI 03/29/21 Patient is a 76 year old Caucasian female who tested positive for COVID on January 10.  She declined to take antiviral therapy.  I was contacted yesterday by nurse from the hospital stating patient felt like her throat was swelling.  Apparently she called 911 and EMS evaluated her at her home.  She states that she felt like her throat was closing.  However "they checked her out and everything was fine".  She then took a hot shower and proceeded to cough up a substantial amount of mucus and after that has felt much better.  She denies any chest pain or pleurisy or hemoptysis or fever or chills.  She continues to have drainage.  She declines to take any type of decongestant such as Coricidin Sudafed because it causes her to have " a rebound effect." She tries to avoid any medications due to fear of side effects.  Today she is afebrile maintaining oxygen saturations.  There is no increased work of breathing.  Her lungs are clear to auscultation bilaterally.  There is no stridor.  There is no evidence of any swelling in the throat.  She has normal voice without any hoarseness or trismus.  She denies any sore throat.  At that time, my plan was: Patient's exam today is reassuring.  There is no abnormality seen on her exam.  I suggested trying Coricidin or Mucinex for postnasal drip and chest congestion.  Patient declines to take any medication.  We did discuss receiving a COVID booster.  At the present time, there is no evidence of any stridor, peritonsillar abscess, swelling in the throat, or difficulty breathing.  Follow-up as needed  04/08/21 Patient states that Saturday, she had violent coughing spells.  She was coughing extremely hard.  After that the following day she felt like her throat was swollen.  She felt difficult to swallow and difficulty breathing.  She denies any hemoptysis.  She denies  any chest pain.  She denies any shortness of breath today.  She states that she has not cough since Saturday.  The swelling sensation in her throat has now subsided.  She denies any headaches, fever, sinus pain, rhinorrhea, postnasal drip, sore throat.  She is speaking without any labored breathing in full and complete sentences.  There is no palpable lymphadenopathy in the neck.  There is no palpable mass.  There is no stridor.  The patient is talking effortlessly.  Her lungs are completely clear to auscultation bilaterally.  I suspect the globus sensation possibly due to anxiety made worse by coughing and drainage.  At that time, my plan was: I reassured the patient today that her exam appears normal.  I do not see an indication for direct laryngoscopy.  I feel that most of this is likely anxiety related globus sensation made worse by coughing and drainage.  Therefore I will treat the patient symptomatically with Tessalon Perles 200 mg every 8 hours as needed for coughing.  Also recommended doing Flonase 2 sprays each nostril daily to help with postnasal drip and silent drainage which may be causing subjective feeling of her throat closing.  I have recommended trying ibuprofen in case there is any swelling in her throat as an anti-inflammatory that she states she cannot tolerate any anti-inflammatory.   05/03/21 Patient went to the emergency room on February 26.  Apparently she took some Mucinex.  Afterwards she again experienced the feeling that her throat was closing.  She went to the emergency room.  I reviewed the emergency room providers notes.   "Presents to the emergency department with a chief complaint of sensation of throat closing.  Patient reports that today at approximately 3:45 PM she took Mucinex extended release.  Approximately 1 hour after taking this pill she gargled organic lemon juice.  Patient states that after gargling organic lemon juice she had "an explosion of foamy white mucus."   Patient states that after this her throat felt like it was closing up and felt scratchy." Her exam evaluation was normal.  ER did prescribe the patient prednisone and gave her an EpiPen.  Patient states she feels like she is in respiratory distress.  However she is talking full and complete sentences.  There is noted labored breathing.  There is no tachypnea.  She states that she feels like she cannot swallow.  She feels like her throat is swollen.  He denies any hemoptysis.  She denies any hematemesis.  Her exam today shows no physical abnormalities. No past medical history on file. 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     benzonatate (TESSALON) 200 MG capsule Take 1 capsule (200 mg total) by mouth 2 (two) times daily as needed for cough. (Patient not taking: Reported on 04/29/2021) 20 capsule 0   cholecalciferol (VITAMIN D3) 25 MCG (1000 UT) tablet Take 1,000 Units by mouth 4 (four) times daily.     EPINEPHrine 0.3 mg/0.3 mL IJ SOAJ injection Inject 0.3 mg into the muscle as needed for anaphylaxis. 1 each 0   fluticasone (FLONASE) 50 MCG/ACT nasal spray Place 2 sprays into both nostrils daily. 16 g 0   vitamin C (ASCORBIC ACID) 250 MG tablet Take 250 mg by mouth daily.     No current facility-administered medications on file prior to visit.   Allergies  Allergen Reactions   Clindamycin/Lincomycin Diarrhea   Ferrous Sulfate     Panic attack like reaction   Penicillins    Social History   Socioeconomic History   Marital status: Married    Spouse name: Peterson Lombard   Number of children: 4   Years of education: Not on file   Highest  education level: Not on file  Occupational History   Not on file  Tobacco Use   Smoking status: Never   Smokeless tobacco: Never  Substance and Sexual Activity   Alcohol use: No   Drug use: No   Sexual activity: Not on file  Other Topics Concern   Not on file  Social History Narrative   4 children    12 grandchildren.   Social Determinants of Health   Financial Resource Strain: Low Risk    Difficulty of Paying Living Expenses: Not hard at all  Food Insecurity: No Food Insecurity   Worried About Programme researcher, broadcasting/film/video in the Last Year: Never true   Ran Out of Food in the Last Year: Never true  Transportation Needs: No Transportation Needs   Lack of Transportation (Medical): No   Lack of Transportation (Non-Medical): No  Physical Activity: Sufficiently Active  Days of Exercise per Week: 5 days   Minutes of Exercise per Session: 30 min  Stress: No Stress Concern Present   Feeling of Stress : Not at all  Social Connections: Socially Integrated   Frequency of Communication with Friends and Family: More than three times a week   Frequency of Social Gatherings with Friends and Family: More than three times a week   Attends Religious Services: 1 to 4 times per year   Active Member of [employer] or Organizations: No   Attends Engineer, structural: 1 to 4 times per year   Marital Status: Married  Catering manager Violence: Not At Risk   Fear of Current or Ex-Partner: No   Emotionally Abused: No   Physically Abused: No   Sexually Abused: No     Review of Systems  All other systems reviewed and are negative.     Objective:   Physical Exam Vitals reviewed.  Constitutional:      General: She is not in acute distress.    Appearance: Normal appearance. She is not ill-appearing, toxic-appearing or diaphoretic.  HENT:     Right Ear: Tympanic membrane and ear canal normal.     Left Ear: Tympanic membrane and ear canal normal.     Nose: Nose normal.     Mouth/Throat:      Mouth: Mucous membranes are moist.     Pharynx: No oropharyngeal exudate or posterior oropharyngeal erythema.  Eyes:     Conjunctiva/sclera: Conjunctivae normal.  Cardiovascular:     Rate and Rhythm: Normal rate and regular rhythm.     Heart sounds: Normal heart sounds. No murmur heard.   No friction rub. No gallop.  Pulmonary:     Effort: Pulmonary effort is normal. No respiratory distress.     Breath sounds: Normal breath sounds. No stridor. No wheezing, rhonchi or rales.  Musculoskeletal:     Cervical back: Neck supple. No rigidity or tenderness.  Lymphadenopathy:     Cervical: No cervical adenopathy.  Neurological:     Mental Status: She is alert.          Assessment & Plan:  Globus sensation  Throat tightness  Laryngospasm Patient appears extremely anxious.  I believe that she likely aspirated a small amount of lemon juice when she was gargling and that may have triggered a laryngospasm.  I then believe that she likely had a panic attack.  I believe the majority of the globus sensation and throat tightness is likely due to anxiety that is uncontrolled.  There may also be an element of silent acid reflux.  I discussed this at length with the patient and she does not feel that this is the case.  She feels that if she could just take vitamin C that she would be better.  I explained to the patient that I have no concerns about her taking vitamin C.  I feel that it would be fine to take vitamin C.  However this is the third time I seen the patient for similar symptoms.  I really feel like she has uncontrolled anxiety or perhaps laryngal esophageal reflux.  In an effort to try to help give her peace of mind I would recommend an ENT consultation for direct laryngoscopy.  Patient seems to deny that any of this could be possible.  She frequently interrupts me while trying to explain what could be going on.  I asked her to trust me because I truly want to help her.  However  she declines to  take any acid reflux medication or to take any anxiety medication.  She will consent to see ENT for laryngoscopy

## 2021-05-04 ENCOUNTER — Emergency Department (HOSPITAL_BASED_OUTPATIENT_CLINIC_OR_DEPARTMENT_OTHER)
Admission: EM | Admit: 2021-05-04 | Discharge: 2021-05-05 | Disposition: A | Discharge status: Home / Self Care | Payer: Medicare Other | Attending: Emergency Medicine | Admitting: Emergency Medicine

## 2021-05-04 ENCOUNTER — Other Ambulatory Visit: Payer: Self-pay

## 2021-05-04 ENCOUNTER — Encounter: Payer: Self-pay | Admitting: Family Medicine

## 2021-05-04 DIAGNOSIS — R252 Cramp and spasm: Secondary | ICD-10-CM | POA: Insufficient documentation

## 2021-05-04 DIAGNOSIS — R0602 Shortness of breath: Secondary | ICD-10-CM | POA: Insufficient documentation

## 2021-05-04 DIAGNOSIS — E876 Hypokalemia: Secondary | ICD-10-CM | POA: Diagnosis not present

## 2021-05-04 DIAGNOSIS — R0789 Other chest pain: Secondary | ICD-10-CM | POA: Insufficient documentation

## 2021-05-04 DIAGNOSIS — Z8616 Personal history of COVID-19: Secondary | ICD-10-CM | POA: Insufficient documentation

## 2021-05-04 DIAGNOSIS — R000 Tachycardia, unspecified: Secondary | ICD-10-CM | POA: Diagnosis not present

## 2021-05-04 DIAGNOSIS — F419 Anxiety disorder, unspecified: Secondary | ICD-10-CM | POA: Diagnosis not present

## 2021-05-04 DIAGNOSIS — M542 Cervicalgia: Secondary | ICD-10-CM | POA: Insufficient documentation

## 2021-05-04 NOTE — ED Triage Notes (Signed)
Shortness of breath with intermittent chest discomfort described as tightness.  Also having chills and cough. ?

## 2021-05-05 ENCOUNTER — Emergency Department (HOSPITAL_BASED_OUTPATIENT_CLINIC_OR_DEPARTMENT_OTHER): Payer: Medicare Other | Admitting: Radiology

## 2021-05-05 ENCOUNTER — Encounter (HOSPITAL_BASED_OUTPATIENT_CLINIC_OR_DEPARTMENT_OTHER): Payer: Self-pay

## 2021-05-05 ENCOUNTER — Other Ambulatory Visit: Payer: Self-pay

## 2021-05-05 ENCOUNTER — Emergency Department (HOSPITAL_BASED_OUTPATIENT_CLINIC_OR_DEPARTMENT_OTHER): Payer: Medicare Other

## 2021-05-05 ENCOUNTER — Other Ambulatory Visit: Payer: Self-pay | Admitting: Family Medicine

## 2021-05-05 DIAGNOSIS — R4189 Other symptoms and signs involving cognitive functions and awareness: Secondary | ICD-10-CM

## 2021-05-05 LAB — CBC
HCT: 39.9 % (ref 36.0–46.0)
Hemoglobin: 13.3 g/dL (ref 12.0–15.0)
MCH: 28.5 pg (ref 26.0–34.0)
MCHC: 33.3 g/dL (ref 30.0–36.0)
MCV: 85.6 fL (ref 80.0–100.0)
Platelets: 239 10*3/uL (ref 150–400)
RBC: 4.66 MIL/uL (ref 3.87–5.11)
RDW: 14.2 % (ref 11.5–15.5)
WBC: 6 10*3/uL (ref 4.0–10.5)
nRBC: 0 % (ref 0.0–0.2)

## 2021-05-05 LAB — BASIC METABOLIC PANEL
Anion gap: 12 (ref 5–15)
BUN: 9 mg/dL (ref 8–23)
CO2: 27 mmol/L (ref 22–32)
Calcium: 10.7 mg/dL — ABNORMAL HIGH (ref 8.9–10.3)
Chloride: 98 mmol/L (ref 98–111)
Creatinine, Ser: 0.52 mg/dL (ref 0.44–1.00)
GFR, Estimated: >60 mL/min (ref >60)
Glucose, Bld: 105 mg/dL — ABNORMAL HIGH (ref 70–99)
Potassium: 3.2 mmol/L — ABNORMAL LOW (ref 3.5–5.1)
Sodium: 137 mmol/L (ref 135–145)

## 2021-05-05 LAB — CK: Total CK: 78 U/L (ref 38–234)

## 2021-05-05 LAB — TROPONIN I (HIGH SENSITIVITY)
Troponin I (High Sensitivity): 7 ng/L (ref <18)
Troponin I (High Sensitivity): 6 ng/L (ref <18)

## 2021-05-05 LAB — MAGNESIUM: Magnesium: 2 mg/dL (ref 1.7–2.4)

## 2021-05-05 LAB — D-DIMER, QUANTITATIVE: D-Dimer, Quant: <0.27 ug/mL-FEU (ref 0.00–0.50)

## 2021-05-05 MED ORDER — SODIUM CHLORIDE 0.9 % IV BOLUS
1000.0000 mL | Freq: Once | INTRAVENOUS | Status: AC
  Administered 2021-05-05: 1000 mL via INTRAVENOUS

## 2021-05-05 MED ORDER — IOHEXOL 300 MG/ML  SOLN
75.0000 mL | Freq: Once | INTRAMUSCULAR | Status: AC | PRN
  Administered 2021-05-05: 75 mL via INTRAVENOUS

## 2021-05-05 MED ORDER — POTASSIUM CHLORIDE CRYS ER 20 MEQ PO TBCR
20.0000 meq | EXTENDED_RELEASE_TABLET | Freq: Once | ORAL | Status: AC
  Administered 2021-05-05: 20 meq via ORAL
  Filled 2021-05-05: qty 1

## 2021-05-05 MED ORDER — DIAZEPAM 5 MG/ML IJ SOLN
2.0000 mg | Freq: Once | INTRAMUSCULAR | Status: DC
  Filled 2021-05-05: qty 2

## 2021-05-05 MED ORDER — POTASSIUM CHLORIDE 10 MEQ/100ML IV SOLN
10.0000 meq | Freq: Once | INTRAVENOUS | Status: AC
  Administered 2021-05-05: 10 meq via INTRAVENOUS
  Filled 2021-05-05: qty 100

## 2021-05-05 NOTE — Discharge Instructions (Addendum)
Please follow up with your PCP to have your electrolytes rechecked in 1-2 weeks. Discontinue use of vitamin D until cleared by your PCP. ? ?Increase your intake of dietary potasium.  ? ?You should see a gynecologist for your vaginal spotting. ?

## 2021-05-05 NOTE — ED Provider Notes (Addendum)
[facility] EMERGENCY DEPT Provider Note  CSN: [id number] Arrival date & time: 05/04/21 2351  Chief Complaint(s) Shortness of Breath  HPI Christina Chase is a 76 y.o. female here for recurrent episode of muscle cramping throughout her body associated with intermittent shortness of breath from the tightness.  She reports that this has been going on since she had prednisone 5 days ago for possible allergic reaction.  Patient also feels that her neck muscles are tightening up.    She also states that she has been more forgetful since receiving prednisone.  States that she had an episode of vaginal spotting the day after prednisone.  She also had another episode tonight.  Patient recently had an COVID-19 and has been taken vitamin C and vitamin D.  After COVID she had "flulike symptoms and nasal congestion which she has been taking Mucinex for.  She had an episode where she had increased secretions after ingesting Mucinex which was the reason for her visit on February 26.  Patient's husband mentioned that for the past 6 months, the patient's memory has been gradually worsening.  He also mentions she becomes agitated and anxious more frequently.  The history is provided by the patient and the spouse.   Past Medical History History reviewed. No pertinent past medical history. Patient Active Problem List   Diagnosis Date Noted   Pain of left hip joint 10/18/2017   Closed left hip fracture, initial encounter (HCC) 10/05/2017   Displaced fracture of left femoral neck (HCC)    Periapical abscess with sinus tract 08/30/2017   Home Medication(s) Prior to Admission medications   Medication Sig Start Date End Date Taking? Authorizing Provider  B Complex Vitamins (VITAMIN-B COMPLEX PO) Take 1 tablet by mouth daily.    [provider]  benzonatate (TESSALON) 200 MG capsule Take 1 capsule (200 mg total) by mouth 2 (two) times daily as needed for cough. 04/08/21   Donita BrooksPickard, Warren  T, MD  cholecalciferol (VITAMIN D3) 25 MCG (1000 UT) tablet Take 1,000 Units by mouth 4 (four) times daily.    [provider]  EPINEPHrine 0.3 mg/0.3 mL IJ SOAJ injection Inject 0.3 mg into the muscle as needed for anaphylaxis. 05/01/21   Haskel SchroederBadalamente, Peter R, PA-C  fluticasone (FLONASE) 50 MCG/ACT nasal spray Place 2 sprays into both nostrils daily. 04/08/21   Donita BrooksPickard, Warren T, MD  vitamin C (ASCORBIC ACID) 250 MG tablet Take 250 mg by mouth daily.    [provider]                                                                                                                                    Allergies Clindamycin/lincomycin, Ferrous sulfate, and Penicillins  Review of Systems Review of Systems As noted in HPI  Physical Exam Vital Signs  I have reviewed the triage vital signs BP (!) 146/97    Pulse 82    Temp 97.6 F (  36.4 C) (Oral)    Resp 15    Ht 5\' 2"  (1.575 m)    Wt 44.5 kg    SpO2 100%    BMI 17.92 kg/m   Physical Exam Vitals reviewed.  Constitutional:      General: She is not in acute distress.    Appearance: She is well-developed. She is not diaphoretic.  HENT:     Head: Normocephalic and atraumatic.     Nose: Nose normal.  Eyes:     General: No scleral icterus.       Right eye: No discharge.        Left eye: No discharge.     Conjunctiva/sclera: Conjunctivae normal.     Pupils: Pupils are equal, round, and reactive to light.  Cardiovascular:     Rate and Rhythm: Regular rhythm. Tachycardia present.     Heart sounds: No murmur heard.   No friction rub. No gallop.     Comments: Becomes tachycardic when discussing her symptoms. Pulmonary:     Effort: Pulmonary effort is normal. No respiratory distress.     Breath sounds: Normal breath sounds. No stridor. No rales.  Abdominal:     General: There is no distension.     Palpations: Abdomen is soft.     Tenderness: There is no abdominal tenderness.  Genitourinary:    Comments: Patient declined GU  exam Musculoskeletal:        General: No tenderness.     Cervical back: Normal range of motion and neck supple.  Skin:    General: Skin is warm and dry.     Findings: No erythema or rash.  Neurological:     Mental Status: She is alert and oriented to person, place, and time.  Psychiatric:        Mood and Affect: Mood is anxious.    ED Results and Treatments Labs (all labs ordered are listed, but only abnormal results are displayed) Labs Reviewed  BASIC METABOLIC PANEL - Abnormal; Notable for the following components:      Result Value   Potassium 3.2 (*)    Glucose, Bld 105 (*)    Calcium 10.7 (*)    All other components within normal limits  CBC  MAGNESIUM  CK  D-DIMER, QUANTITATIVE (NOT AT [facility])  TROPONIN I (HIGH SENSITIVITY)  TROPONIN I (HIGH SENSITIVITY)                                                                                                                         EKG  EKG Interpretation  Date/Time:  Thursday May 05 2021 00:11:09 EST Ventricular Rate:  81 PR Interval:  170 QRS Duration: 85 QT Interval:  356 QTC Calculation: 414 R Axis:   56 Text Interpretation: Sinus rhythm Confirmed by Drema Pryardama, Tola Meas [phone]([id number]) on 05/05/2021 12:24:36 AM       Radiology DG Chest 2 View  Result Date: 05/05/2021 CLINICAL DATA:  Chest tightness. EXAM: CHEST - 2 VIEW COMPARISON:  Chest  radiograph dated 10/05/2017. FINDINGS: Background of emphysema. No focal consolidation, pleural effusion or pneumothorax. The cardiac silhouette is within normal limits. There is calcification of the mitral annulus. Atherosclerotic calcification of the aortic arch. No acute osseous pathology. IMPRESSION: 1. No active cardiopulmonary disease. 2. Emphysema. Electronically Signed   By: Elgie Collard M.D.   On: 05/05/2021 00:34   CT Head Wo Contrast  Result Date: 05/05/2021 CLINICAL DATA:  Headache, new or worsening (Age >= 50y) EXAM: CT HEAD WITHOUT CONTRAST TECHNIQUE: Contiguous axial images were  obtained from the base of the skull through the vertex without intravenous contrast. RADIATION DOSE REDUCTION: This exam was performed according to the departmental dose-optimization program which includes automated exposure control, adjustment of the mA and/or kV according to patient size and/or use of iterative reconstruction technique. COMPARISON:  None. FINDINGS: Brain: Normal anatomic configuration. Mild parenchymal volume loss is commensurate with the patient's age. No abnormal intra or extra-axial mass lesion or fluid collection. No abnormal mass effect or midline shift. No evidence of acute intracranial hemorrhage or infarct. Ventricular size is normal. Cerebellum unremarkable. Vascular: Unremarkable Skull: Intact Sinuses/Orbits: Paranasal sinuses are clear. Orbits are unremarkable. Other: Mastoid air cells and middle ear cavities are clear. IMPRESSION: No acute intracranial abnormality. Electronically Signed   By: Helyn Numbers M.D.   On: 05/05/2021 04:08   CT Soft Tissue Neck W Contrast  Result Date: 05/05/2021 CLINICAL DATA:  76 year old female with headache and sore throat. Globus sensation. EXAM: CT NECK WITH CONTRAST TECHNIQUE: Multidetector CT imaging of the neck was performed using the standard protocol following the bolus administration of intravenous contrast. RADIATION DOSE REDUCTION: This exam was performed according to the departmental dose-optimization program which includes automated exposure control, adjustment of the mA and/or kV according to patient size and/or use of iterative reconstruction technique. CONTRAST:  55mL OMNIPAQUE IOHEXOL 300 MG/ML  SOLN COMPARISON:  Face CT 12/02/2018. FINDINGS: Pharynx and larynx: Symmetric and normal larynx. Normal epiglottis. Postinflammatory calcifications of the palatine tonsils. Pharyngeal soft tissue contours are normal. Capacious pharynx distended by gas. Parapharyngeal and retropharyngeal spaces are normal. Salivary glands: Negative.  Negative  sublingual space. Thyroid: Small bilateral mixed density thyroid nodules, the largest is 12 mm on the left (series 2, image 87). Not clinically significant; no follow-up imaging recommended (ref: J Am Coll Radiol. 2015 Feb;12(2): 143-50). Lymph nodes: Negative. Diminutive cervical lymph nodes, 5 mm short axis or smaller in the bilateral neck. Vascular: Major vascular structures in the neck and at the skull base are patent. Calcified left ICA origin atherosclerosis. Calcified atherosclerosis at the skull base. Limited intracranial: Negative. Visualized orbits: Negative. Mastoids and visualized paranasal sinuses: Clear. Tympanic cavities are clear. Skeleton: No acute dental finding. Bilateral TMJ degeneration. Cervical spine degeneration with mild degenerative spondylolisthesis. No acute osseous abnormality identified. Upper chest: Negative lung apices. IMPRESSION: 1. Negative neck soft tissues. No acute or inflammatory process identified. 2. Calcified left carotid atherosclerosis. Cervical spine and TMJ degeneration. Electronically Signed   By: Odessa Fleming M.D.   On: 05/05/2021 04:12    Pertinent labs & imaging results that were available during my care of the patient were reviewed by me and considered in my medical decision making (see MDM for details).  Medications Ordered in ED Medications  diazepam (VALIUM) injection 2 mg (2 mg Intravenous Not Given 05/05/21 0148)  sodium chloride 0.9 % bolus 1,000 mL (0 mLs Intravenous Stopped 05/05/21 0514)  potassium chloride 10 mEq in 100 mL IVPB (0 mEq Intravenous Stopped 05/05/21 0342)  potassium chloride SA (KLOR-CON M) CR tablet 20 mEq (20 mEq Oral Given 05/05/21 0149)  iohexol (OMNIPAQUE) 300 MG/ML solution 75 mL (75 mLs Intravenous Contrast Given 05/05/21 0402)                                                                                                                                     Procedures Procedures  (including critical care time)  Medical Decision  Making / ED Course    Complexity of Problem:  Co morbidities that complicate the patient evaluation N/a   Additional history obtained: Husband On review of records, patient has been seen at her PCPs office for globus sensation since she had COVID.  Patient's presenting problem/concern and DDX listed below: Chest, neck and extremity tightness Likely related to muscle cramping.  Will assess for any electrolyte derangements. Will get CK Patient is intermittently tachycardic mostly while discussing her symptoms.  This may be related to anxiety but will need to rule out PE.  Low suspicion for PE thus will obtain a dimer. Chest tightness is atypical for ACS but given age will rule out with delta Trop as she has a heart score less than 3. Her presentation is not classic for arctic dissection. CT neck to assess globus sensation and rule out mass Possible prednisone side effect, but less likely Reported 6 month of gradual cognitive change Possible early Alz/dementia Will get CT head to rule out mass or old stroke     Complexity of Data:   Cardiac Monitoring: The patient was maintained on a cardiac monitor.   I personally viewed and interpreted the cardiac monitored which showed an underlying rhythm of normal sinus rhythm with rates in the 80s-90s when I walking the room, up to 110-120s when she discusses her symptoms.  No dysrhythmias or blocks.   Laboratory Tests ordered listed below with my independent interpretation: CBC without leukocytosis or anemia. Metabolic panel notable for mild hypokalemia and hypercalcemia.  Magnesium normal. CK normal. Dimer within normal limits. EKG without acute ischemic changes or evidence of pericarditis.  Troponins negative x2.     Imaging Studies ordered listed below with my independent interpretation: Chest x-ray without evidence of pneumonia, pneumothorax, pulmonary edema. CT head negative for mass effect or remote stroke. CT soft tissue without  evidence of mass.     ED Course:    Based on above concerns, hospitalization possible: Yes, if she ruled in for PE, ACS or showed evidence of stroke  Assessment, Intervention, and Reassessment: Muscle cramping Likely related to electrolyte derangements Hypokalemia Given IV and oral repletion Recommend she increase her dietary intake of potassium Hypercalcemia, mild Likely secondary to vitamin D ingestion. Given IV fluids Recommended she discontinue vitamin D use Recommend she follow-up closely with her PCP in 1 to 2 weeks for repeat metabolic panel Patient ruled out for ACS and PE  Vaginal spotting Patient declined GU exam No [place] at this time  of night. GYN follow up recommended Cognitive decline No evidence of remote stroke on CT head. 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 Recommend she follow-up closely with her PCP to have neurocognitive testing.    Final Clinical Impression(s) / ED Diagnoses Final diagnoses:  Muscle cramping  Hypokalemia  Hypercalcemia   The patient appears reasonably screened and/or stabilized for discharge and I doubt any other medical condition or other [facility] requiring further screening, evaluation, or treatment in the ED at this time prior to discharge. Safe for discharge with strict return precautions.  Disposition: Discharge  Condition: Good  I have discussed the results, Dx and Tx plan with the patient/family who expressed understanding and agree(s) with the plan. Discharge instructions discussed at length. The patient/family was given strict return precautions who verbalized understanding of the instructions. No further questions at time of discharge.    ED Discharge Orders     None        Follow Up: Donita Brooks, MD [address] [place] [address] [phone]  Call  to schedule an appointment for close follow up in 1-2 weeks           This chart was dictated using voice recognition software.  Despite best  efforts to proofread,  errors can occur which can change the documentation meaning.      Nira Conn, MD 05/05/21 [phone]

## 2021-05-06 ENCOUNTER — Encounter: Payer: Self-pay | Admitting: Family Medicine

## 2021-09-16 ENCOUNTER — Other Ambulatory Visit: Payer: Self-pay

## 2021-09-16 ENCOUNTER — Other Ambulatory Visit: Payer: Self-pay | Admitting: Family Medicine

## 2021-09-16 ENCOUNTER — Other Ambulatory Visit: Payer: Medicare Other

## 2021-09-16 DIAGNOSIS — R5383 Other fatigue: Secondary | ICD-10-CM

## 2021-09-17 LAB — CBC WITH DIFFERENTIAL/PLATELET
Absolute Monocytes: 496 cells/uL (ref 200–950)
Basophils Absolute: 18 cells/uL (ref 0–200)
Basophils Relative: 0.3 %
Eosinophils Absolute: 30 cells/uL (ref 15–500)
Eosinophils Relative: 0.5 %
HCT: 43.7 % (ref 35.0–45.0)
Hemoglobin: 14.4 g/dL (ref 11.7–15.5)
Lymphs Abs: 1569 cells/uL (ref 850–3900)
MCH: 29.1 pg (ref 27.0–33.0)
MCHC: 33 g/dL (ref 32.0–36.0)
MCV: 88.3 fL (ref 80.0–100.0)
MPV: 11 fL (ref 7.5–12.5)
Monocytes Relative: 8.4 %
Neutro Abs: 3788 cells/uL (ref 1500–7800)
Neutrophils Relative %: 64.2 %
Platelets: 197 10*3/uL (ref 140–400)
RBC: 4.95 10*6/uL (ref 3.80–5.10)
RDW: 12.9 % (ref 11.0–15.0)
Total Lymphocyte: 26.6 %
WBC: 5.9 10*3/uL (ref 3.8–10.8)

## 2021-09-17 LAB — COMPREHENSIVE METABOLIC PANEL
AG Ratio: 2 (calc) (ref 1.0–2.5)
ALT: 20 U/L (ref 6–29)
AST: 23 U/L (ref 10–35)
Albumin: 4.7 g/dL (ref 3.6–5.1)
Alkaline phosphatase (APISO): 66 U/L (ref 37–153)
BUN: 8 mg/dL (ref 7–25)
CO2: 27 mmol/L (ref 20–32)
Calcium: 10.1 mg/dL (ref 8.6–10.4)
Chloride: 101 mmol/L (ref 98–110)
Creat: 0.63 mg/dL (ref 0.60–1.00)
Globulin: 2.3 g/dL (calc) (ref 1.9–3.7)
Glucose, Bld: 98 mg/dL (ref 65–99)
Potassium: 4.9 mmol/L (ref 3.5–5.3)
Sodium: 138 mmol/L (ref 135–146)
Total Bilirubin: 0.7 mg/dL (ref 0.2–1.2)
Total Protein: 7 g/dL (ref 6.1–8.1)

## 2021-09-17 LAB — TSH: TSH: 1.82 mIU/L (ref 0.40–4.50)

## 2021-09-17 LAB — VITAMIN B12: Vitamin B-12: 580 pg/mL (ref 200–1100)

## 2021-09-20 ENCOUNTER — Encounter: Payer: Self-pay | Admitting: Family Medicine

## 2021-09-20 ENCOUNTER — Ambulatory Visit (INDEPENDENT_AMBULATORY_CARE_PROVIDER_SITE_OTHER): Payer: Medicare Other | Admitting: Family Medicine

## 2021-09-20 VITALS — BP 114/52 | HR 107 | Temp 98.5°F | Ht 62.0 in | Wt 107.2 lb

## 2021-09-20 DIAGNOSIS — R4189 Other symptoms and signs involving cognitive functions and awareness: Secondary | ICD-10-CM | POA: Diagnosis not present

## 2021-09-20 NOTE — Progress Notes (Addendum)
Subjective:    Patient ID: Christina Chase, female    DOB: 1945/11/10, 76 y.o.   MRN: [id number]  HPI 03/29/21 Patient is a 76 year old Caucasian female who tested positive for COVID on January 10.  She declined to take antiviral therapy.  I was contacted yesterday by nurse from the hospital stating patient felt like her throat was swelling.  Apparently she called 911 and EMS evaluated her at her home.  She states that she felt like her throat was closing.  However "they checked her out and everything was fine".  She then took a hot shower and proceeded to cough up a substantial amount of mucus and after that has felt much better.  She denies any chest pain or pleurisy or hemoptysis or fever or chills.  She continues to have drainage.  She declines to take any type of decongestant such as Coricidin Sudafed because it causes her to have " a rebound effect." She tries to avoid any medications due to fear of side effects.  Today she is afebrile maintaining oxygen saturations.  There is no increased work of breathing.  Her lungs are clear to auscultation bilaterally.  There is no stridor.  There is no evidence of any swelling in the throat.  She has normal voice without any hoarseness or trismus.  She denies any sore throat.  At that time, my plan was: Patient's exam today is reassuring.  There is no abnormality seen on her exam.  I suggested trying Coricidin or Mucinex for postnasal drip and chest congestion.  Patient declines to take any medication.  We did discuss receiving a COVID booster.  At the present time, there is no evidence of any stridor, peritonsillar abscess, swelling in the throat, or difficulty breathing.  Follow-up as needed  04/08/21 Patient states that Saturday, she had violent coughing spells.  She was coughing extremely hard.  After that the following day she felt like her throat was swollen.  She felt difficult to swallow and difficulty breathing.  She denies any hemoptysis.  She denies  any chest pain.  She denies any shortness of breath today.  She states that she has not cough since Saturday.  The swelling sensation in her throat has now subsided.  She denies any headaches, fever, sinus pain, rhinorrhea, postnasal drip, sore throat.  She is speaking without any labored breathing in full and complete sentences.  There is no palpable lymphadenopathy in the neck.  There is no palpable mass.  There is no stridor.  The patient is talking effortlessly.  Her lungs are completely clear to auscultation bilaterally.  I suspect the globus sensation possibly due to anxiety made worse by coughing and drainage.  At that time, my plan was: I reassured the patient today that her exam appears normal.  I do not see an indication for direct laryngoscopy.  I feel that most of this is likely anxiety related globus sensation made worse by coughing and drainage.  Therefore I will treat the patient symptomatically with Tessalon Perles 200 mg every 8 hours as needed for coughing.  Also recommended doing Flonase 2 sprays each nostril daily to help with postnasal drip and silent drainage which may be causing subjective feeling of her throat closing.  I have recommended trying ibuprofen in case there is any swelling in her throat as an anti-inflammatory that she states she cannot tolerate any anti-inflammatory.   05/03/21 Patient went to the emergency room on February 26.  Apparently she took some Mucinex.  Afterwards she again experienced the feeling that her throat was closing.  She went to the emergency room.  I reviewed the emergency room providers notes.   "Presents to the emergency department with a chief complaint of sensation of throat closing.  Patient reports that today at approximately 3:45 PM she took Mucinex extended release.  Approximately 1 hour after taking this pill she gargled organic lemon juice.  Patient states that after gargling organic lemon juice she had "an explosion of foamy white mucus."   Patient states that after this her throat felt like it was closing up and felt scratchy." Her exam evaluation was normal.  ER did prescribe the patient prednisone and gave her an EpiPen.  Patient states she feels like she is in respiratory distress.  However she is talking full and complete sentences.  There is noted labored breathing.  There is no tachypnea.  She states that she feels like she cannot swallow.  She feels like her throat is swollen.  He denies any hemoptysis.  She denies any hematemesis.  Her exam today shows no physical abnormalities.  At that time, my plan was: Patient appears extremely anxious.  I believe that she likely aspirated a small amount of lemon juice when she was gargling and that may have triggered a laryngospasm.  I then believe that she likely had a panic attack.  I believe the majority of the globus sensation and throat tightness is likely due to anxiety that is uncontrolled.  There may also be an element of silent acid reflux.  I discussed this at length with the patient and she does not feel that this is the case.  She feels that if she could just take vitamin C that she would be better.  I explained to the patient that I have no concerns about her taking vitamin C.  I feel that it would be fine to take vitamin C.  However this is the third time I seen the patient for similar symptoms.  I really feel like she has uncontrolled anxiety or perhaps laryngal esophageal reflux.  In an effort to try to help give her peace of mind I would recommend an ENT consultation for direct laryngoscopy.  Patient seems to deny that any of this could be possible.  She frequently interrupts me while trying to explain what could be going on.  I asked her to trust me because I truly want to help her.  However she declines to take any acid reflux medication or to take any anxiety medication.  She will consent to see ENT for laryngoscopy  09/20/21 Patient is here today to discuss her lab work.  After I  last saw the patient in February she went to the emergency room.  I reviewed the emergency room physician's note.  There was mention of memory loss and uncontrolled anxiety.  They had recommended a referral for neuropsychological testing.  I discussed this today with the patient.  She adamantly denies any memory loss.  She is here today unaccompanied.  However, report from the emergency room physician indicates that her husband is concerned about increasing confusion and short-term memory problems as well as increasing agitation.  Again the patient denies this.  Patient fixates on reactions to food.  I mention her diet.  She refuses to eat red meat.  She states that eating oranges causes sharp needlelike pains in her joints particularly in her hands and in her back.  She reports becoming weak whenever she eats certain foods.  The patient has fixed beliefs that certain foods cause abnormal physical responses that do not coincide with known medical issues.  They cause atypical pains, bloating, joint pains etc.  Patient denies depression.  She denies hallucinations.  She denies suicidal thoughts.  She denies mania.   No past medical history on file. 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     benzonatate (TESSALON) 200 MG capsule Take 1 capsule (200 mg total) by mouth 2 (two) times daily as needed for cough. 20 capsule 0   cholecalciferol (VITAMIN D3) 25 MCG (1000 UT) tablet Take 1,000 Units by mouth 4 (four) times daily.     EPINEPHrine 0.3 mg/0.3 mL IJ SOAJ injection Inject 0.3 mg into the muscle as needed for anaphylaxis. 1 each 0   vitamin C (ASCORBIC  ACID) 250 MG tablet Take 250 mg by mouth daily.     fluticasone (FLONASE) 50 MCG/ACT nasal spray Place 2 sprays into both nostrils daily. (Patient not taking: Reported on 09/20/2021) 16 g 0   No current facility-administered medications on file prior to visit.   Allergies  Allergen Reactions   Clindamycin/Lincomycin Diarrhea   Ferrous Sulfate     Panic attack like reaction   Penicillins        Review of Systems  All other systems reviewed and are negative.      Objective:   Physical Exam Vitals reviewed.  Constitutional:      General: She is not in acute distress.    Appearance: Normal appearance. She is not ill-appearing, toxic-appearing or diaphoretic.  HENT:     Right Ear: Tympanic membrane and ear canal normal.     Left Ear: Tympanic membrane and ear canal normal.     Nose: Nose normal.     Mouth/Throat:     Mouth: Mucous membranes are moist.     Pharynx: No oropharyngeal exudate or posterior oropharyngeal erythema.  Eyes:     Conjunctiva/sclera: Conjunctivae normal.  Cardiovascular:     Rate and Rhythm: Normal rate and regular rhythm.     Heart sounds: Normal heart sounds. No murmur heard.    No friction rub. No gallop.  Pulmonary:     Effort: Pulmonary effort is normal. No respiratory distress.     Breath sounds: Normal breath sounds. No stridor. No wheezing, rhonchi or rales.  Musculoskeletal:     Cervical back: Neck supple. No rigidity or tenderness.  Lymphadenopathy:     Cervical: No cervical adenopathy.  Neurological:     Mental Status: She is alert.           Assessment & Plan:  Cognitive decline I have recommended the patient neuropsychological testing through neurology.  I am concerned that the patient may be demonstrating short-term memory problems.  However I am concerned that there may be underlying psychological issues that may be contributing that could be better managed than they are currently.  I emphasized to the patient that I only want  to help.  I question possible type A personality disorder versus uncontrolled anxiety could be playing a role.  I believe a thorough evaluation by psychologist would be beneficial.  Patient declines this  at the present time.  However I stated that I would be happy to make a referral if she changes her mind. Orders Only on 09/16/2021  Component Date Value Ref Range Status   TSH 09/16/2021 1.82  0.40 - 4.50 mIU/L Final   Vitamin B-12 09/16/2021 580  200 - 1,100 pg/mL Final   WBC 09/16/2021 5.9  3.8 - 10.8 Thousand/uL Final   RBC 09/16/2021 4.95  3.80 - 5.10 Million/uL Final   Hemoglobin 09/16/2021 14.4  11.7 - 15.5 g/dL Final   HCT 09/16/2021 43.7  35.0 - 45.0 % Final   MCV 09/16/2021 88.3  80.0 - 100.0 fL Final   MCH 09/16/2021 29.1  27.0 - 33.0 pg Final   MCHC 09/16/2021 33.0  32.0 - 36.0 g/dL Final   RDW 09/16/2021 12.9  11.0 - 15.0 % Final   Platelets 09/16/2021 197  140 - 400 Thousand/uL Final   MPV 09/16/2021 11.0  7.5 - 12.5 fL Final   Neutro Abs 09/16/2021 3,788  1,500 - 7,800 cells/uL Final   Lymphs Abs 09/16/2021 1,569  850 - 3,900 cells/uL Final   Absolute Monocytes 09/16/2021 496  200 - 950 cells/uL Final   Eosinophils Absolute 09/16/2021 30  15 - 500 cells/uL Final   Basophils Absolute 09/16/2021 18  0 - 200 cells/uL Final   Neutrophils Relative % 09/16/2021 64.2  % Final   Total Lymphocyte 09/16/2021 26.6  % Final   Monocytes Relative 09/16/2021 8.4  % Final   Eosinophils Relative 09/16/2021 0.5  % Final   Basophils Relative 09/16/2021 0.3  % Final   Glucose, Bld 09/16/2021 98  65 - 99 mg/dL Final   Comment: .            Fasting reference interval .    BUN 09/16/2021 8  7 - 25 mg/dL Final   Creat 09/16/2021 0.63  0.60 - 1.00 mg/dL Final   BUN/Creatinine Ratio [id number] NOT APPLICABLE  6 - 22 (calc) Final   Sodium 09/16/2021 138  135 - 146 mmol/L Final   Potassium 09/16/2021 4.9  3.5 - 5.3 mmol/L Final   Chloride 09/16/2021 101  98 - 110 mmol/L Final   CO2 09/16/2021  27  20 - 32 mmol/L Final   Calcium 09/16/2021 10.1  8.6 - 10.4 mg/dL Final   Total Protein 09/16/2021 7.0  6.1 - 8.1 g/dL Final   Albumin 09/16/2021 4.7  3.6 - 5.1 g/dL Final   Globulin 09/16/2021 2.3  1.9 - 3.7 g/dL (calc) Final   AG Ratio 09/16/2021 2.0  1.0 - 2.5 (calc) Final   Total Bilirubin 09/16/2021 0.7  0.2 - 1.2 mg/dL Final   Alkaline phosphatase (APISO) 09/16/2021 66  37 - 153 U/L Final   AST 09/16/2021 23  10 - 35 U/L Final   ALT 09/16/2021 20  6 - 29 U/L Final   Spent more than 30 minutes today in discussion with the patient

## 2021-09-26 ENCOUNTER — Telehealth: Payer: Self-pay

## 2021-09-26 NOTE — Telephone Encounter (Signed)
Spoke with pt and stated that she would like to know if you can give her a referral to have an US done on her gall bladder. Stated that its starting to bother her some. Pt also stated that she had talked to you some during her visit on 09/20/21.   Please advice?

## 2021-09-27 ENCOUNTER — Other Ambulatory Visit: Payer: Self-pay | Admitting: Family Medicine

## 2021-09-27 DIAGNOSIS — R1011 Right upper quadrant pain: Secondary | ICD-10-CM

## 2021-09-29 ENCOUNTER — Ambulatory Visit
Admission: RE | Admit: 2021-09-29 | Discharge: 2021-09-29 | Disposition: A | Discharge status: Home / Self Care | Payer: Medicare Other | Source: Ambulatory Visit | Attending: Family Medicine | Admitting: Family Medicine

## 2021-09-29 DIAGNOSIS — R1011 Right upper quadrant pain: Secondary | ICD-10-CM

## 2021-09-30 ENCOUNTER — Encounter: Payer: Self-pay | Admitting: Family Medicine

## 2021-10-10 ENCOUNTER — Ambulatory Visit (INDEPENDENT_AMBULATORY_CARE_PROVIDER_SITE_OTHER): Payer: Medicare Other | Admitting: Family Medicine

## 2021-10-10 VITALS — BP 112/62 | HR 107 | Temp 98.7°F | Ht 62.0 in | Wt 107.8 lb

## 2021-10-10 DIAGNOSIS — R1013 Epigastric pain: Secondary | ICD-10-CM

## 2021-10-10 DIAGNOSIS — R4189 Other symptoms and signs involving cognitive functions and awareness: Secondary | ICD-10-CM | POA: Diagnosis not present

## 2021-10-10 DIAGNOSIS — R1011 Right upper quadrant pain: Secondary | ICD-10-CM

## 2021-10-10 DIAGNOSIS — R5383 Other fatigue: Secondary | ICD-10-CM

## 2021-10-10 NOTE — Progress Notes (Signed)
Subjective:    Patient ID: Christina Chase, female    DOB: 1945/07/31, 76 y.o.   MRN: [id number]  HPI 03/29/21 Patient is a 76 year old Caucasian female who tested positive for COVID on January 10.  She declined to take antiviral therapy.  I was contacted yesterday by nurse from the hospital stating patient felt like her throat was swelling.  Apparently she called 911 and EMS evaluated her at her home.  She states that she felt like her throat was closing.  However "they checked her out and everything was fine".  She then took a hot shower and proceeded to cough up a substantial amount of mucus and after that has felt much better.  She denies any chest pain or pleurisy or hemoptysis or fever or chills.  She continues to have drainage.  She declines to take any type of decongestant such as Coricidin Sudafed because it causes her to have " a rebound effect." She tries to avoid any medications due to fear of side effects.  Today she is afebrile maintaining oxygen saturations.  There is no increased work of breathing.  Her lungs are clear to auscultation bilaterally.  There is no stridor.  There is no evidence of any swelling in the throat.  She has normal voice without any hoarseness or trismus.  She denies any sore throat.  At that time, my plan was: Patient's exam today is reassuring.  There is no abnormality seen on her exam.  I suggested trying Coricidin or Mucinex for postnasal drip and chest congestion.  Patient declines to take any medication.  We did discuss receiving a COVID booster.  At the present time, there is no evidence of any stridor, peritonsillar abscess, swelling in the throat, or difficulty breathing.  Follow-up as needed  04/08/21 Patient states that Saturday, she had violent coughing spells.  She was coughing extremely hard.  After that the following day she felt like her throat was swollen.  She felt difficult to swallow and difficulty breathing.  She denies any hemoptysis.  She denies  any chest pain.  She denies any shortness of breath today.  She states that she has not cough since Saturday.  The swelling sensation in her throat has now subsided.  She denies any headaches, fever, sinus pain, rhinorrhea, postnasal drip, sore throat.  She is speaking without any labored breathing in full and complete sentences.  There is no palpable lymphadenopathy in the neck.  There is no palpable mass.  There is no stridor.  The patient is talking effortlessly.  Her lungs are completely clear to auscultation bilaterally.  I suspect the globus sensation possibly due to anxiety made worse by coughing and drainage.  At that time, my plan was: I reassured the patient today that her exam appears normal.  I do not see an indication for direct laryngoscopy.  I feel that most of this is likely anxiety related globus sensation made worse by coughing and drainage.  Therefore I will treat the patient symptomatically with Tessalon Perles 200 mg every 8 hours as needed for coughing.  Also recommended doing Flonase 2 sprays each nostril daily to help with postnasal drip and silent drainage which may be causing subjective feeling of her throat closing.  I have recommended trying ibuprofen in case there is any swelling in her throat as an anti-inflammatory that she states she cannot tolerate any anti-inflammatory.   05/03/21 Patient went to the emergency room on February 26.  Apparently she took some Mucinex.  Afterwards she again experienced the feeling that her throat was closing.  She went to the emergency room.  I reviewed the emergency room providers notes.   "Presents to the emergency department with a chief complaint of sensation of throat closing.  Patient reports that today at approximately 3:45 PM she took Mucinex extended release.  Approximately 1 hour after taking this pill she gargled organic lemon juice.  Patient states that after gargling organic lemon juice she had "an explosion of foamy white mucus."   Patient states that after this her throat felt like it was closing up and felt scratchy." Her exam evaluation was normal.  ER did prescribe the patient prednisone and gave her an EpiPen.  Patient states she feels like she is in respiratory distress.  However she is talking full and complete sentences.  There is noted labored breathing.  There is no tachypnea.  She states that she feels like she cannot swallow.  She feels like her throat is swollen.  He denies any hemoptysis.  She denies any hematemesis.  Her exam today shows no physical abnormalities.  At that time, my plan was: Patient appears extremely anxious.  I believe that she likely aspirated a small amount of lemon juice when she was gargling and that may have triggered a laryngospasm.  I then believe that she likely had a panic attack.  I believe the majority of the globus sensation and throat tightness is likely due to anxiety that is uncontrolled.  There may also be an element of silent acid reflux.  I discussed this at length with the patient and she does not feel that this is the case.  She feels that if she could just take vitamin C that she would be better.  I explained to the patient that I have no concerns about her taking vitamin C.  I feel that it would be fine to take vitamin C.  However this is the third time I seen the patient for similar symptoms.  I really feel like she has uncontrolled anxiety or perhaps laryngal esophageal reflux.  In an effort to try to help give her peace of mind I would recommend an ENT consultation for direct laryngoscopy.  Patient seems to deny that any of this could be possible.  She frequently interrupts me while trying to explain what could be going on.  I asked her to trust me because I truly want to help her.  However she declines to take any acid reflux medication or to take any anxiety medication.  She will consent to see ENT for laryngoscopy  09/20/21 Patient is here today to discuss her lab work.  After I  last saw the patient in February she went to the emergency room.  I reviewed the emergency room physician's note.  There was mention of memory loss and uncontrolled anxiety.  They had recommended a referral for neuropsychological testing.  I discussed this today with the patient.  She adamantly denies any memory loss.  She is here today unaccompanied.  However, report from the emergency room physician indicates that her husband is concerned about increasing confusion and short-term memory problems as well as increasing agitation.  Again the patient denies this.  Patient fixates on reactions to food.  I mention her diet.  She refuses to eat red meat.  She states that eating oranges causes sharp needlelike pains in her joints particularly in her hands and in her back.  She reports becoming weak whenever she eats certain foods.  The patient has fixed beliefs that certain foods cause abnormal physical responses that do not coincide with known medical issues.  They cause atypical pains, bloating, joint pains etc.  Patient denies depression.  She denies hallucinations.  She denies suicidal thoughts.  She denies mania.  At that time, my plan was:  I have recommended the patient neuropsychological testing through neurology.  I am concerned that the patient may be demonstrating short-term memory problems.  However I am concerned that there may be underlying psychological issues that may be contributing that could be better managed than they are currently.  I emphasized to the patient that I only want to help.  I question possible type A personality disorder versus uncontrolled anxiety could be playing a role.  I believe a thorough evaluation by psychologist would be beneficial.  Patient declines this at the present time.  However I stated that I would be happy to make a referral if she changes her mind.  10/10/21 Patient is here today to discuss treatment for fatty liver disease.  She recently had an ultrasound due to  elevated liver function test which showed heterogeneous enhancement consistent with hepatic steatosis.  Since that time, the patient has done considerable research on the [facility] website and is tried drinking lemon water to help detoxify her liver.  I am concerned that she is not getting sufficient protein and that this could be causing a lot of her fatigue.  She states that she was concerned because my note mentions her issues with certain foods.  Therefore today we discussed this in detail.  She cannot tolerate red meat.  She states that red meat does not leave her stomach after she eats it so she has bloating.  She cannot tolerate orange juice because our foods causes a circular headache and shooting electrical-like pains in her abdomen.  She cannot eat tomatoes because they cause a rash on her face.  She cannot eat cheese because of bloating.  Frequently I would mention of food and she was stated that she is unable to eat that due to "gallbladder problems".  However I have a very difficult time pinning the patient down as to the actual symptom that this includes food.  I feel that she is eating a very restricted diet.  She states that she never had restrictive diet prior to having COVID.  She believes that this is long COVID and that COVID is causing her to be unable to eat certain foods.  I was honest with the patient and I discussed with her that I believe some of these food aversions may be delusional and be a symptom of a larger underlying problem.  I again recommended a referral for neuropsychological testing however the patient became upset by this and states that men only want to send people to psychiatrist when they have problems. No past medical history on file. 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     benzonatate (TESSALON) 200 MG capsule Take 1 capsule (200 mg total) by mouth 2 (two) times daily as needed for cough. 20 capsule 0   cholecalciferol (VITAMIN D3) 25 MCG (1000 UT) tablet  Take 1,000 Units by mouth 4 (four) times daily.     EPINEPHrine 0.3 mg/0.3 mL IJ SOAJ injection Inject 0.3 mg into the muscle as needed for anaphylaxis. 1 each 0   fluticasone (FLONASE) 50 MCG/ACT nasal spray Place 2 sprays into both nostrils daily. 16 g 0   vitamin C (ASCORBIC ACID) 250 MG tablet Take 250 mg by mouth daily.     No current facility-administered medications on file prior to visit.   Allergies  Allergen Reactions   Clindamycin/Lincomycin Diarrhea   Ferrous Sulfate     Panic attack like reaction   Penicillins        Review of Systems  All other systems reviewed and are negative.      Objective:   Physical Exam Vitals reviewed.  Constitutional:      General: She is not in acute distress.    Appearance: Normal appearance. She is not ill-appearing, toxic-appearing or diaphoretic.  Cardiovascular:     Rate and Rhythm: Normal rate and regular rhythm.     Heart sounds: Normal heart sounds.  Pulmonary:     Effort: Pulmonary effort is normal.     Breath sounds: Normal breath sounds.  Neurological:     Mental Status: She is alert.           Assessment & Plan:  RUQ pain  Cognitive decline  Fatigue, unspecified type  Dyspepsia No exam was performed today.  We spent over 30 minutes in discussion. I explained to the patient that I am concerned that her food aversions, her somatic abdominal pains, and her cognitive decline may be related to an underlying psychological issue.  I admitted that I could be wrong, but this is my primary concern.  I cannot explain the physical symptoms that she has to different foods.   Also believe that her fatigue is likely due to her restrictive diet.  I believe that if I can get the patient to liberalize her diet her energy level will improve.  I also believe that she would benefit from a psychologist/psychiatrist evaluation because I feel that if we could address some of her anxieties in a more healthy matter, her quality of life would improve.  Patient politely disagrees with me.  Therefore I recommended that we get a second opinion with GI for a more objective evaluation and than my opinion.  I did explain that my goal for this patient is to get her the best care possible so that she would feel better.  I do not believe that I can address her abdominal pain and fatigue and dyspepsia without treating some of the underlying psychological component.  However if I am mistaken, I apologize to her.  Therefore I recommended that we get GIs opinion

## 2021-10-13 ENCOUNTER — Ambulatory Visit: Payer: Medicare Other | Admitting: Family Medicine

## 2022-02-02 ENCOUNTER — Ambulatory Visit (INDEPENDENT_AMBULATORY_CARE_PROVIDER_SITE_OTHER): Payer: Medicare Other

## 2022-02-02 DIAGNOSIS — Z23 Encounter for immunization: Secondary | ICD-10-CM

## 2022-06-22 ENCOUNTER — Emergency Department (HOSPITAL_BASED_OUTPATIENT_CLINIC_OR_DEPARTMENT_OTHER): Payer: Medicare Other | Admitting: Radiology

## 2022-06-22 ENCOUNTER — Encounter (HOSPITAL_BASED_OUTPATIENT_CLINIC_OR_DEPARTMENT_OTHER): Payer: Self-pay

## 2022-06-22 ENCOUNTER — Emergency Department (HOSPITAL_BASED_OUTPATIENT_CLINIC_OR_DEPARTMENT_OTHER)
Admission: EM | Admit: 2022-06-22 | Discharge: 2022-06-22 | Disposition: A | Discharge status: Home / Self Care | Payer: Medicare Other | Attending: Emergency Medicine | Admitting: Emergency Medicine

## 2022-06-22 ENCOUNTER — Other Ambulatory Visit: Payer: Self-pay

## 2022-06-22 DIAGNOSIS — Y92481 Parking lot as the place of occurrence of the external cause: Secondary | ICD-10-CM | POA: Diagnosis not present

## 2022-06-22 DIAGNOSIS — S7002XA Contusion of left hip, initial encounter: Secondary | ICD-10-CM | POA: Diagnosis not present

## 2022-06-22 DIAGNOSIS — M25552 Pain in left hip: Secondary | ICD-10-CM | POA: Diagnosis present

## 2022-06-22 DIAGNOSIS — W228XXA Striking against or struck by other objects, initial encounter: Secondary | ICD-10-CM | POA: Diagnosis not present
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
## 2022-06-22 NOTE — ED Notes (Signed)
ED Provider at bedside. 

## 2022-06-22 NOTE — ED Notes (Signed)
Ambulatory to restroom
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
## 2022-06-22 NOTE — ED Notes (Signed)
Pt discharged to home using teachback Method. Discharge instructions have been discussed with patient and/or family members. Pt verbally acknowledges understanding d/c instructions, has been given opportunity for questions to be answered, and endorses comprehension to checkout at registration before leaving.  

## 2022-06-26 ENCOUNTER — Encounter: Payer: Self-pay | Admitting: Family Medicine

## 2022-06-26 ENCOUNTER — Ambulatory Visit (INDEPENDENT_AMBULATORY_CARE_PROVIDER_SITE_OTHER): Payer: Medicare Other | Admitting: Family Medicine

## 2022-06-26 VITALS — BP 122/68 | HR 91 | Temp 97.6°F | Ht 62.0 in | Wt 110.4 lb

## 2022-06-26 DIAGNOSIS — M542 Cervicalgia: Secondary | ICD-10-CM
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
## 2022-07-04 ENCOUNTER — Encounter: Payer: Self-pay | Admitting: Family Medicine

## 2022-07-05 IMAGING — CT CT NECK W/ CM
4 of 5 series · 14 of 33 positions shown, 16 images · IV contrast (APPLIED)
Comparison: Face CT 12/02/2018.

CLINICAL DATA: 75-year-old female with headache and sore throat.
Globus sensation.

EXAM:
CT NECK WITH CONTRAST
TECHNIQUE: Multidetector CT imaging of the neck was performed using the
standard protocol following the bolus administration of intravenous
contrast.

[Series 3: ax bone · axial · 0.58mm/px · z∈[+917,+1013]mm · 3 of 98 slices shown, 4 images]
[im 25/98  soft-tissue]
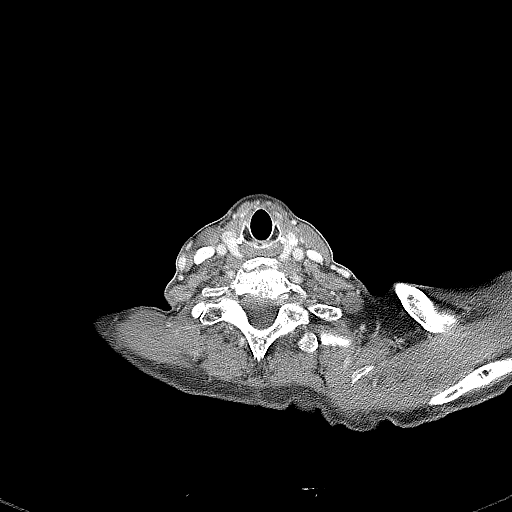
[im 25/98  bone]
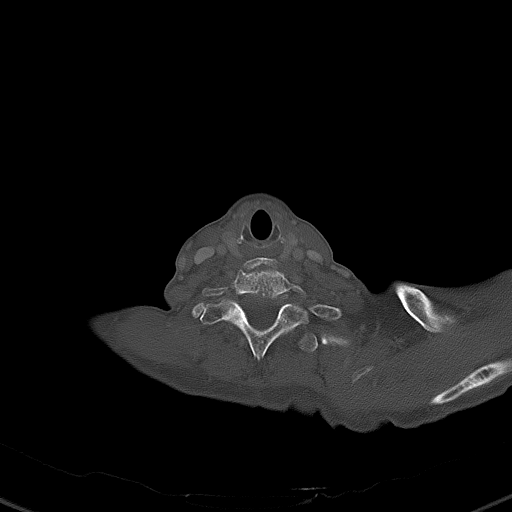
[im 49/98  bone]
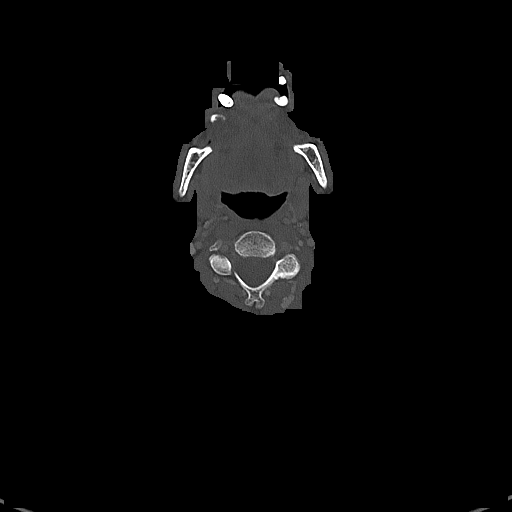
[im 73/98  bone]
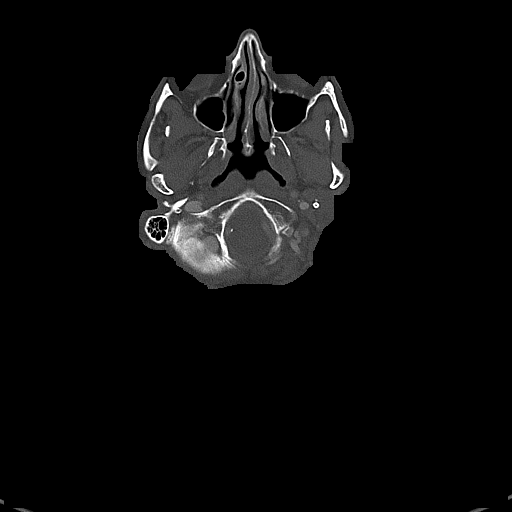

[Series 4: cor neck · coronal · 0.34mm/px · 3 of 108 slices shown]
[im 22/108  bone]
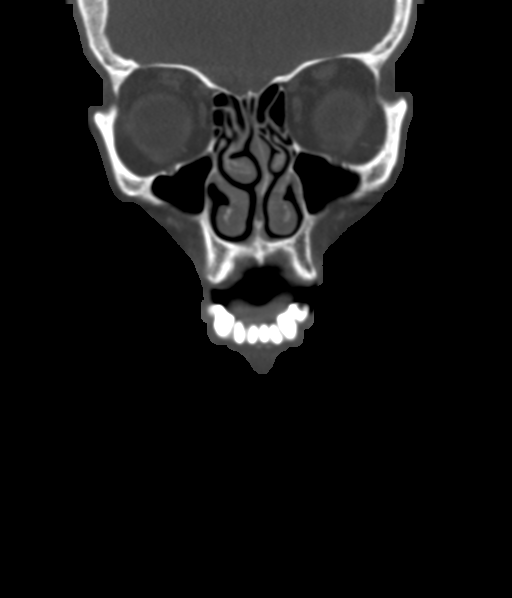
[im 43/108  bone]
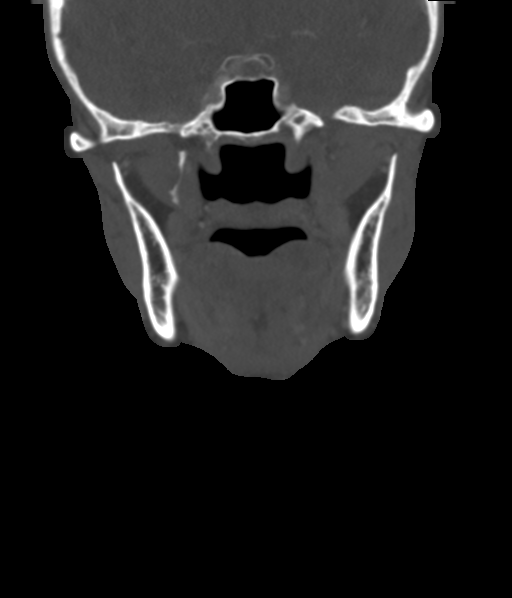
[im 65/108  bone]
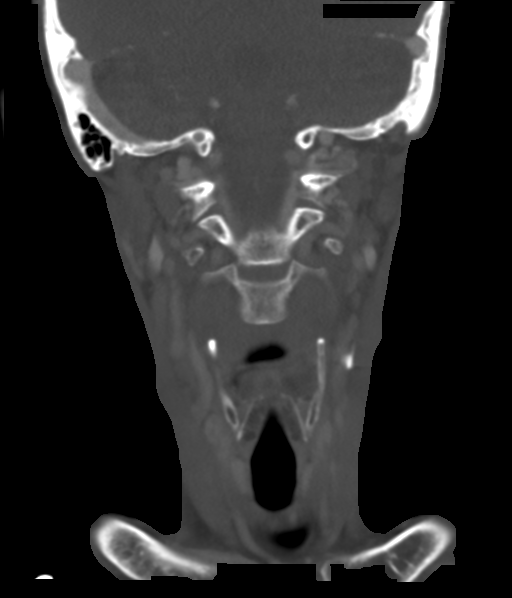

[Series 5: sag neck · sagittal · 0.41mm/px · 5 of 86 slices shown, 6 images]
[im 29/86  bone]
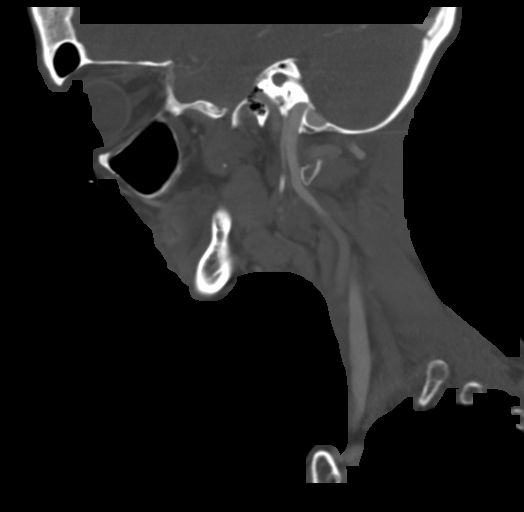
[im 36/86  bone]
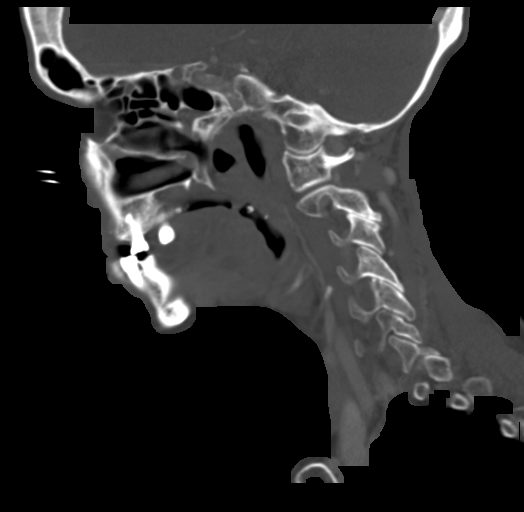
[im 43/86  soft-tissue]
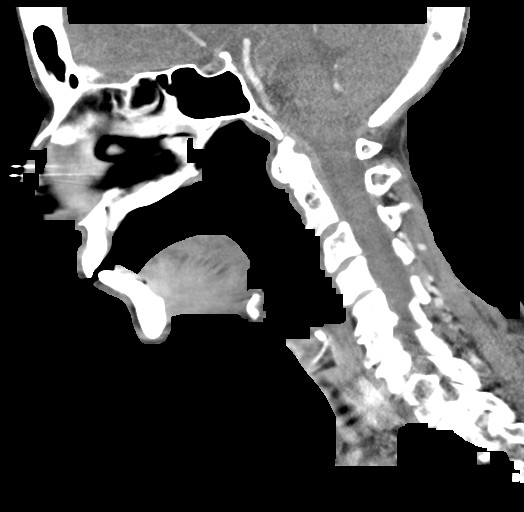
[im 43/86  bone]
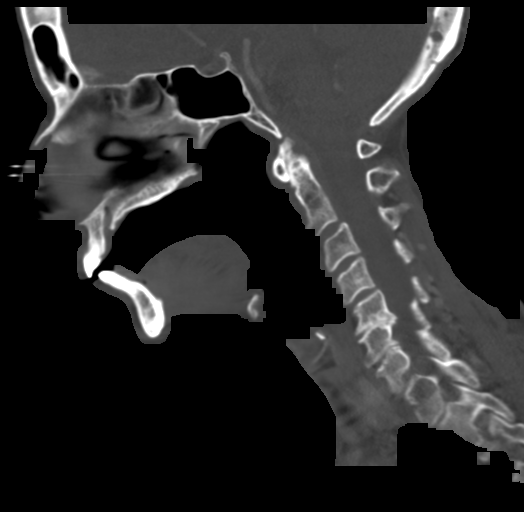
[im 50/86  bone]
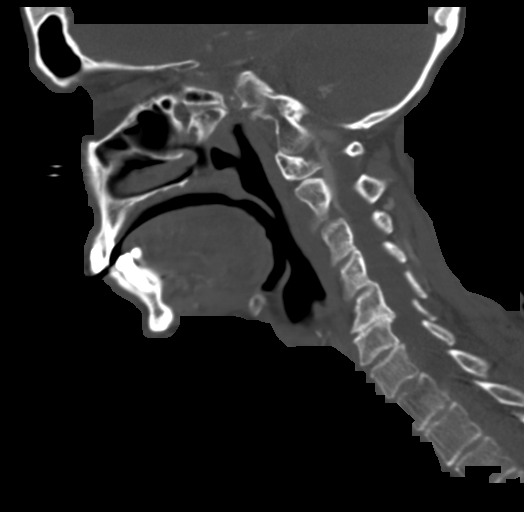
[im 57/86  bone]
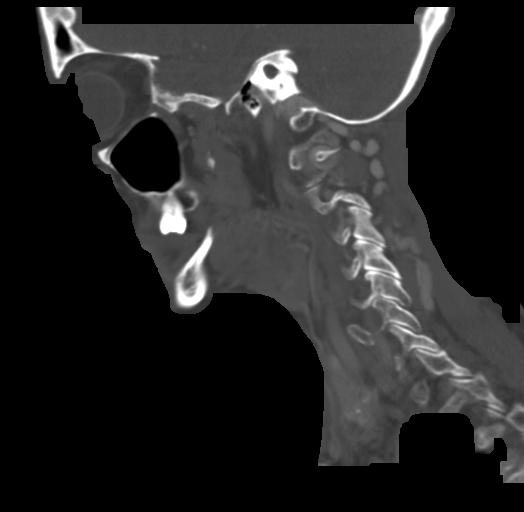

[Series 6: ax oropharynx · axial · 0.34mm/px · z∈[+915,+1015]mm · 3 of 100 slices shown]
[im 25/100  bone]
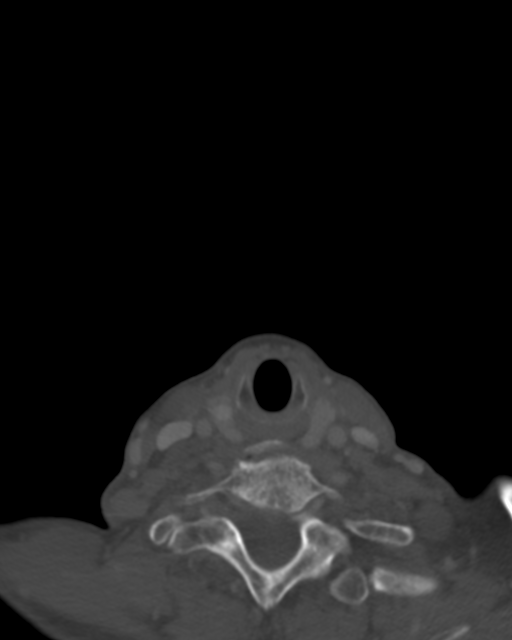
[im 50/100  bone]
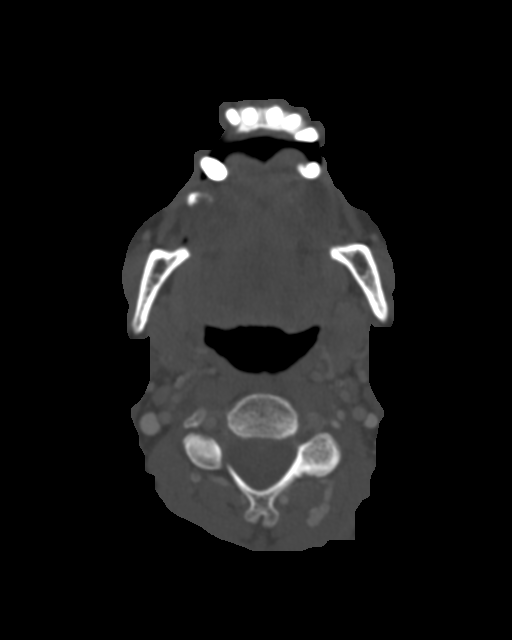
[im 75/100  bone]
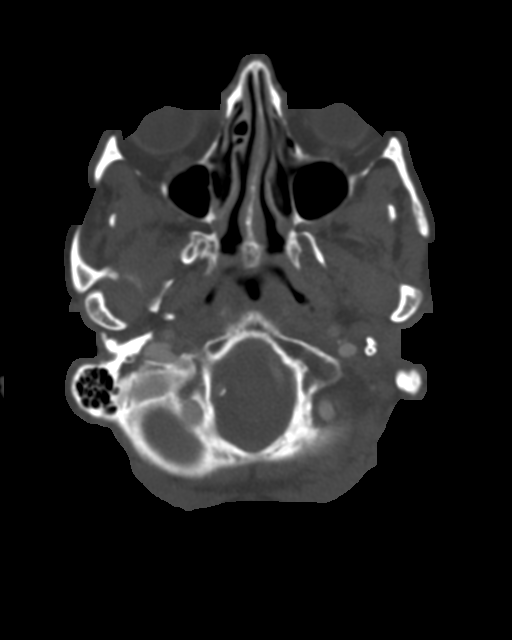

[14 of 33 positions shown; findings below may reference images not displayed]

RADIATION DOSE REDUCTION: This exam was performed according to the
departmental dose-optimization program which includes automated
exposure control, adjustment of the mA and/or kV according to
patient size and/or use of iterative reconstruction technique.

CONTRAST:  75mL OMNIPAQUE IOHEXOL 300 MG/ML  SOLN
FINDINGS: Pharynx and larynx: Symmetric and normal larynx. Normal epiglottis.
Postinflammatory calcifications of the palatine tonsils. Pharyngeal
soft tissue contours are normal. Capacious pharynx distended by gas.
Parapharyngeal and retropharyngeal spaces are normal.

Salivary glands: Negative.  Negative sublingual space.

Thyroid: Small bilateral mixed density thyroid nodules, the largest
is 12 mm on the left (series 2, image 87). Not clinically
significant; no follow-up imaging recommended (ref: [HOSPITAL]. [DATE]): 143-50).

Lymph nodes: Negative. Diminutive cervical lymph nodes, 5 mm short
axis or smaller in the bilateral neck.

Vascular: Major vascular structures in the neck and at the skull
base are patent. Calcified left ICA origin atherosclerosis.
Calcified atherosclerosis at the skull base.

Limited intracranial: Negative.

Visualized orbits: Negative.

Mastoids and visualized paranasal sinuses: Clear. Tympanic cavities
are clear.

Skeleton: No acute dental finding. Bilateral TMJ degeneration.
Cervical spine degeneration with mild degenerative
spondylolisthesis. No acute osseous abnormality identified.

Upper chest: Negative lung apices.
IMPRESSION: 1. Negative neck soft tissues. No acute or inflammatory process
identified.
2. Calcified left carotid atherosclerosis. Cervical spine and TMJ
degeneration.

## 2022-09-04 ENCOUNTER — Ambulatory Visit (INDEPENDENT_AMBULATORY_CARE_PROVIDER_SITE_OTHER): Payer: Medicare Other | Admitting: Family Medicine

## 2022-09-04 VITALS — BP 138/78 | HR 102 | Temp 98.1°F | Ht 62.0 in | Wt 111.0 lb

## 2022-09-04 DIAGNOSIS — R4189 Other symptoms and signs involving cognitive functions and awareness: Secondary | ICD-10-CM

## 2022-09-04 NOTE — Progress Notes (Signed)
Subjective:    Patient ID: Christina Chase, female    DOB: [birth year], 77 y.o.   MRN: [id number]  HPI  09/20/21 Patient is here today to discuss her lab work.  After I last saw the patient in February she went to the emergency room.  I reviewed the emergency room physician's note.  There was mention of memory loss and uncontrolled anxiety.  They had recommended a referral for neuropsychological testing.  I discussed this today with the patient.  She adamantly denies any memory loss.  She is here today unaccompanied.  However, report from the emergency room physician indicates that her husband is concerned about increasing confusion and short-term memory problems as well as increasing agitation.  Again the patient denies this.  Patient fixates on reactions to food.  I mention her diet.  She refuses to eat red meat.  She states that eating oranges causes sharp needlelike pains in her joints particularly in her hands and in her back.  She reports becoming weak whenever she eats certain foods.  The patient has fixed beliefs that certain foods cause abnormal physical responses that do not coincide with known medical issues.  They cause atypical pains, bloating, joint pains etc.  Patient denies depression.  She denies hallucinations.  She denies suicidal thoughts.  She denies mania.  At that time, my plan was:  I have recommended the patient neuropsychological testing through neurology.  I am concerned that the patient may be demonstrating short-term memory problems.  However I am concerned that there may be underlying psychological issues that may be contributing that could be better managed than they are currently.  I emphasized to the patient that I only want to help.  I question possible type A personality disorder versus uncontrolled anxiety could be playing a role.  I believe a thorough evaluation by psychologist would be beneficial.  Patient declines this at the present time.  However I stated that I  would be happy to make a referral if she changes her mind.  09/04/22 Patient is here today for cognitive assessment.  I initially performed a Mini-Mental status exam.  She is able to tell me the correct year patient continue to correct that she can tell me today for the month directly.  Therefore she misses 2 points here.  She is unable to remember 1 out of 3 words on recall subtracting another point from her total school.  She had a very difficult time performing serial sevens scoring 4 out of 5 with several attempts.  She was able to spell world in reverse.  However on clock drawing, she had a difficult time drawing the proper position of the hands on the clock.  I asked her to draw  4:45.  She initially drew a small and of the 4 that did not touch the center of the clock was off to the right-hand side.  She then drew another hand touching the 5 and then drilled long and from the center of the clock to 9.  During entire time, she was repeatedly reporting that she was under stress and the stress could be affecting her performance.  I scored her Mini-Mental status exam 26 out of 30. No past medical history on file. 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     benzonatate (TESSALON) 200 MG capsule Take 1 capsule (200 mg total) by mouth 2 (two) times daily as needed for cough. 20 capsule 0   cholecalciferol (VITAMIN D3) 25 MCG (1000 UT) tablet Take 1,000 Units by mouth 4 (four) times daily.     EPINEPHrine 0.3 mg/0.3 mL IJ SOAJ injection Inject 0.3 mg into the muscle as needed for anaphylaxis. 1 each 0   fluticasone (FLONASE) 50 MCG/ACT nasal spray Place  2 sprays into both nostrils daily. 16 g 0   vitamin C (ASCORBIC ACID) 250 MG tablet Take 250 mg by mouth daily.     No current facility-administered medications on file prior to visit.   Allergies  Allergen Reactions   Clindamycin/Lincomycin Diarrhea   Ferrous Sulfate     Panic attack like reaction   Penicillins        Review of Systems  All other systems reviewed and are negative.      Objective:   Physical Exam Vitals reviewed.  Constitutional:      General: She is not in acute distress.    Appearance: Normal appearance. She is not ill-appearing, toxic-appearing or diaphoretic.  Cardiovascular:     Rate and Rhythm: Normal rate and regular rhythm.     Heart sounds: Normal heart sounds.  Pulmonary:     Effort: Pulmonary effort is normal.     Breath sounds: Normal breath sounds.  Neurological:     Mental Status: She is alert.          Assessment & Plan:  Cognitive decline I explained to the patient that I feel her initial screening cognitive assessment was abnormal.  I believe that she would benefit from additional cognitive testing/neuropsychological testing through [facility].  Strong recommends test that the patient refuses.  Also believe that there is a psychological component contributing however the patient declines my suggestion and declines my referral

## 2023-01-15 ENCOUNTER — Ambulatory Visit: Payer: Medicare Other | Admitting: Family Medicine

## 2023-01-15 ENCOUNTER — Ambulatory Visit (INDEPENDENT_AMBULATORY_CARE_PROVIDER_SITE_OTHER): Payer: Medicare Other

## 2023-01-15 DIAGNOSIS — Z23 Encounter for immunization: Secondary | ICD-10-CM | POA: Diagnosis not present

## 2023-01-19 ENCOUNTER — Encounter: Payer: Self-pay | Admitting: Family Medicine

## 2023-04-11 ENCOUNTER — Telehealth: Payer: Self-pay

## 2023-04-11 NOTE — Telephone Encounter (Signed)
 Copied from CRM [id number]. Topic: Clinical - Request for Lab/Test Order >> Apr 11, 2023  1:26 PM Elle L wrote: Reason for CRM: The patient was requesting to see if she will need lab work done before her annual physical. Her call back number is [phone].

## 2023-04-16 ENCOUNTER — Other Ambulatory Visit: Payer: Medicare Other

## 2023-04-16 DIAGNOSIS — Z13220 Encounter for screening for lipoid disorders: Secondary | ICD-10-CM

## 2023-04-16 DIAGNOSIS — R5383 Other fatigue: Secondary | ICD-10-CM

## 2023-04-17 LAB — COMPLETE METABOLIC PANEL WITH GFR
AG Ratio: 2.1 (calc) (ref 1.0–2.5)
ALT: 13 U/L (ref 6–29)
AST: 20 U/L (ref 10–35)
Albumin: 4.8 g/dL (ref 3.6–5.1)
Alkaline phosphatase (APISO): 75 U/L (ref 37–153)
BUN/Creatinine Ratio: 13 (calc) (ref 6–22)
BUN: 6 mg/dL — ABNORMAL LOW (ref 7–25)
CO2: 23 mmol/L (ref 20–32)
Calcium: 10.2 mg/dL (ref 8.6–10.4)
Chloride: 101 mmol/L (ref 98–110)
Creat: 0.48 mg/dL — ABNORMAL LOW (ref 0.60–1.00)
Globulin: 2.3 g/dL (ref 1.9–3.7)
Glucose, Bld: 93 mg/dL (ref 65–99)
Potassium: 3.6 mmol/L (ref 3.5–5.3)
Sodium: 137 mmol/L (ref 135–146)
Total Bilirubin: 0.8 mg/dL (ref 0.2–1.2)
Total Protein: 7.1 g/dL (ref 6.1–8.1)
eGFR: 96 mL/min/{1.73_m2} (ref >=60)

## 2023-04-17 LAB — CBC WITH DIFFERENTIAL/PLATELET
Absolute Lymphocytes: 2301 {cells}/uL (ref 850–3900)
Absolute Monocytes: 387 {cells}/uL (ref 200–950)
Basophils Absolute: 22 {cells}/uL (ref 0–200)
Basophils Relative: 0.5 %
Eosinophils Absolute: 69 {cells}/uL (ref 15–500)
Eosinophils Relative: 1.6 %
HCT: 44.4 % (ref 35.0–45.0)
Hemoglobin: 14.3 g/dL (ref 11.7–15.5)
MCH: 28 pg (ref 27.0–33.0)
MCHC: 32.2 g/dL (ref 32.0–36.0)
MCV: 87.1 fL (ref 80.0–100.0)
MPV: 10.4 fL (ref 7.5–12.5)
Monocytes Relative: 9 %
Neutro Abs: 1522 {cells}/uL (ref 1500–7800)
Neutrophils Relative %: 35.4 %
Platelets: 247 10*3/uL (ref 140–400)
RBC: 5.1 10*6/uL (ref 3.80–5.10)
RDW: 12.7 % (ref 11.0–15.0)
Total Lymphocyte: 53.5 %
WBC: 4.3 10*3/uL (ref 3.8–10.8)

## 2023-04-17 LAB — LIPID PANEL
Cholesterol: 243 mg/dL — ABNORMAL HIGH (ref <200)
HDL: 82 mg/dL (ref >=50)
LDL Cholesterol (Calc): 143 mg/dL — ABNORMAL HIGH
Non-HDL Cholesterol (Calc): 161 mg/dL — ABNORMAL HIGH (ref <130)
Total CHOL/HDL Ratio: 3 (calc) (ref <5.0)
Triglycerides: 79 mg/dL (ref <150)

## 2023-04-17 LAB — TSH: TSH: 2.8 m[IU]/L (ref 0.40–4.50)

## 2023-04-20 ENCOUNTER — Ambulatory Visit (INDEPENDENT_AMBULATORY_CARE_PROVIDER_SITE_OTHER): Payer: Medicare Other | Admitting: Family Medicine

## 2023-04-20 ENCOUNTER — Encounter: Payer: Self-pay | Admitting: Family Medicine

## 2023-04-20 VITALS — BP 118/62 | HR 94 | Temp 97.9°F | Ht 62.0 in | Wt 115.2 lb

## 2023-04-20 DIAGNOSIS — Z0000 Encounter for general adult medical examination without abnormal findings: Secondary | ICD-10-CM | POA: Diagnosis not present

## 2023-04-20 DIAGNOSIS — Z1231 Encounter for screening mammogram for malignant neoplasm of breast: Secondary | ICD-10-CM

## 2023-04-20 NOTE — Progress Notes (Signed)
 Subjective:    Patient ID: Christina Chase, female    DOB: 08-02-1945, 78 y.o.   MRN: [id number]  HPI Patient is 78 year old Caucasian female here today for complete physical exam.  She is due for the new pneumonia vaccine, shingles vaccine, and RSV vaccine, and a tetanus shot.  She declines all vaccinations today.  Due to her age, she does not require colonoscopy or a Pap smear.  She is due for a mammogram.  She hesitantly allows me to schedule her for a mammogram.  She declines to allow me to schedule her for a bone density test.  Her most recent lab work is listed below.  It is significant for elevated cholesterol Lab on 04/16/2023  Component Date Value Ref Range Status   Cholesterol 04/16/2023 243 (H)  <200 mg/dL Final   HDL 40/98/1191 82  > OR = 50 mg/dL Final   Triglycerides 47/82/9562 79  <150 mg/dL Final   LDL Cholesterol (Calc) 04/16/2023 143 (H)  mg/dL (calc) Final   Comment: Reference range: <100 . Desirable range <100 mg/dL for primary prevention;   <70 mg/dL for patients with CHD or diabetic patients  with > or = 2 CHD risk factors. Marland Kitchen LDL-C is now calculated using the Martin-Hopkins  calculation, which is a validated novel method providing  better accuracy than the Friedewald equation in the  estimation of LDL-C.  Horald Pollen et al. Lenox Ahr. 1308;657(84): 2061-2068  (http://education.[facility].com/faq/FAQ164)    Total CHOL/HDL Ratio 04/16/2023 3.0  <6.9 (calc) Final   Non-HDL Cholesterol (Calc) 04/16/2023 161 (H)  <130 mg/dL (calc) Final   Comment: For patients with diabetes plus 1 major ASCVD risk  factor, treating to a non-HDL-C goal of <100 mg/dL  (LDL-C of <62 mg/dL) is considered a therapeutic  option.    WBC 04/16/2023 4.3  3.8 - 10.8 Thousand/uL Final   RBC 04/16/2023 5.10  3.80 - 5.10 Million/uL Final   Hemoglobin 04/16/2023 14.3  11.7 - 15.5 g/dL Final   HCT 95/28/4132 44.4  35.0 - 45.0 % Final   MCV 04/16/2023 87.1  80.0 - 100.0 fL Final   MCH  04/16/2023 28.0  27.0 - 33.0 pg Final   MCHC 04/16/2023 32.2  32.0 - 36.0 g/dL Final   Comment: For adults, a slight decrease in the calculated MCHC value (in the range of 30 to 32 g/dL) is most likely not clinically significant; however, it should be interpreted with caution in correlation with other red cell parameters and the patient's clinical condition.    RDW 04/16/2023 12.7  11.0 - 15.0 % Final   Platelets 04/16/2023 247  140 - 400 Thousand/uL Final   MPV 04/16/2023 10.4  7.5 - 12.5 fL Final   Neutro Abs 04/16/2023 1,522  1,500 - 7,800 cells/uL Final   Absolute Lymphocytes 04/16/2023 2,301  850 - 3,900 cells/uL Final   Absolute Monocytes 04/16/2023 387  200 - 950 cells/uL Final   Eosinophils Absolute 04/16/2023 69  15 - 500 cells/uL Final   Basophils Absolute 04/16/2023 22  0 - 200 cells/uL Final   Neutrophils Relative % 04/16/2023 35.4  % Final   Total Lymphocyte 04/16/2023 53.5  % Final   Monocytes Relative 04/16/2023 9.0  % Final   Eosinophils Relative 04/16/2023 1.6  % Final   Basophils Relative 04/16/2023 0.5  % Final   Glucose, Bld 04/16/2023 93  65 - 99 mg/dL Final   Comment: .            Fasting reference interval .  BUN 04/16/2023 6 (L)  7 - 25 mg/dL Final   Creat 82/95/6213 0.48 (L)  0.60 - 1.00 mg/dL Final   eGFR 08/65/7846 96  > OR = 60 mL/min/1.47m2 Final   BUN/Creatinine Ratio 04/16/2023 13  6 - 22 (calc) Final   Sodium 04/16/2023 137  135 - 146 mmol/L Final   Potassium 04/16/2023 3.6  3.5 - 5.3 mmol/L Final   Chloride 04/16/2023 101  98 - 110 mmol/L Final   CO2 04/16/2023 23  20 - 32 mmol/L Final   Calcium 04/16/2023 10.2  8.6 - 10.4 mg/dL Final   Total Protein 96/29/5284 7.1  6.1 - 8.1 g/dL Final   Albumin 13/24/4010 4.8  3.6 - 5.1 g/dL Final   Globulin 27/25/3664 2.3  1.9 - 3.7 g/dL (calc) Final   AG Ratio 04/16/2023 2.1  1.0 - 2.5 (calc) Final   Total Bilirubin 04/16/2023 0.8  0.2 - 1.2 mg/dL Final   Alkaline phosphatase (APISO) 04/16/2023 75  37 -  153 U/L Final   AST 04/16/2023 20  10 - 35 U/L Final   ALT 04/16/2023 13  6 - 29 U/L Final   TSH 04/16/2023 2.80  0.40 - 4.50 mIU/L Final    No past medical history on file. 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     cholecalciferol (VITAMIN D3) 25 MCG (1000 UT) tablet Take 1,000 Units by mouth 4 (four) times daily.     EPINEPHrine 0.3 mg/0.3 mL IJ SOAJ injection Inject 0.3 mg into the muscle as needed for anaphylaxis. 1 each 0   fluticasone (FLONASE) 50 MCG/ACT nasal spray Place 2 sprays into both nostrils daily. (Patient not taking: Reported on 04/20/2023) 16 g 0   vitamin C (ASCORBIC ACID) 250 MG tablet Take 250 mg by mouth daily.     No current facility-administered medications on file prior to visit.   Allergies  Allergen Reactions   Clindamycin/Lincomycin Diarrhea   Penicillins        Review of Systems  All other systems reviewed and are negative.      Objective:   Physical Exam Vitals reviewed.  Constitutional:      General: She is not in acute distress.    Appearance: Normal appearance. She is not ill-appearing, toxic-appearing or diaphoretic.  HENT:     Right Ear: Tympanic membrane and ear canal normal.     Left Ear: Tympanic membrane and ear canal normal.     Nose: Nose normal. No congestion or rhinorrhea.     Mouth/Throat:     Mouth: Mucous membranes are moist.     Pharynx: No oropharyngeal exudate or posterior oropharyngeal erythema.  Eyes:     General:        Right eye: No discharge.        Left eye: No discharge.     Extraocular Movements: Extraocular movements intact.     Conjunctiva/sclera:  Conjunctivae normal.     Pupils: Pupils are equal, round, and reactive to light.  Neck:     Vascular: No carotid bruit.  Cardiovascular:     Rate and Rhythm: Normal rate and regular rhythm.     Heart  sounds: Normal heart sounds.  Pulmonary:     Effort: Pulmonary effort is normal.     Breath sounds: Normal breath sounds. No wheezing, rhonchi or rales.  Abdominal:     General: Abdomen is flat. Bowel sounds are normal. There is no distension.     Palpations: Abdomen is soft.     Tenderness: There is no abdominal tenderness. There is no guarding.  Musculoskeletal:     Cervical back: Normal range of motion. No rigidity or tenderness.     Right lower leg: No edema.     Left lower leg: No edema.  Lymphadenopathy:     Cervical: No cervical adenopathy.  Skin:    Findings: No erythema.  Neurological:     General: No focal deficit present.     Mental Status: She is alert. Mental status is at baseline.     Cranial Nerves: No cranial nerve deficit.     Motor: No weakness.     Coordination: Coordination normal.           Assessment & Plan:  Encounter for screening mammogram for malignant neoplasm of breast - Plan: MM 3D SCREENING MAMMOGRAM BILATERAL BREAST, CANCELED: MM Digital Screening  General medical exam I recommended a pneumonia vaccine, shingles vaccine, tetanus shot, and the RSV vaccine.  Patient declined these vaccinations today.  I recommended a mammogram.  Patient agrees to the mammogram.  I recommended a bone density test.  Patient declines a bone density test.  Did recommend calcium 12 mg a day and vitamin D 1000 units a day.  Recommended treating cholesterol.  Patient prefers to work on diet and recheck cholesterol in 3 months.  Offer the patient a coronary artery calcium score which she declined.

## 2023-05-01 ENCOUNTER — Ambulatory Visit
Admission: RE | Admit: 2023-05-01 | Discharge: 2023-05-01 | Disposition: A | Discharge status: Home / Self Care | Payer: Medicare Other | Source: Ambulatory Visit | Attending: Family Medicine | Admitting: Family Medicine

## 2023-05-01 DIAGNOSIS — Z1231 Encounter for screening mammogram for malignant neoplasm of breast: Secondary | ICD-10-CM

## 2023-05-23 DIAGNOSIS — E7800 Pure hypercholesterolemia, unspecified: Secondary | ICD-10-CM | POA: Insufficient documentation

## 2023-06-14 ENCOUNTER — Ambulatory Visit (INDEPENDENT_AMBULATORY_CARE_PROVIDER_SITE_OTHER): Admitting: Family Medicine

## 2023-06-14 ENCOUNTER — Encounter: Payer: Self-pay | Admitting: Family Medicine

## 2023-06-14 VITALS — BP 126/86 | HR 106 | Temp 98.5°F | Ht 62.0 in | Wt 117.2 lb

## 2023-06-14 DIAGNOSIS — K056 Periodontal disease, unspecified: Secondary | ICD-10-CM | POA: Diagnosis not present

## 2023-06-14 NOTE — Progress Notes (Signed)
 Subjective:    Patient ID: Christina Chase, female    DOB: [birth year], 78 y.o.   MRN: [id number]  Dental Injury     Patient's right upper canine tooth is extremely loose and starting to fall out of its socket.  Patient presents today to ask me if she believes she could take an antibiotic.  The story is difficult to follow.  Several years ago, the patient states that she inhaled some type of material while out in the yard mowing.  She immediately developed pain and infection in her right maxillary sinus.  She was given antibiotics for this.  Shortly thereafter she states that her right premolar began to dissolve and fall out.  She has associated this with the antibiotic.  Now years later, the right canine tooth is starting to become extremely loose.  Patient has poor dentition with periodontal disease.  There is some bleeding at the gumline around the tooth.  There is no palpable abscess.  There is some tenderness to palpation at the gumline where the tooth is loose No past medical history on file. 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     cholecalciferol (VITAMIN D3) 25 MCG (1000 UT) tablet Take 1,000 Units by mouth 4 (four) times daily.     fluticasone (FLONASE) 50 MCG/ACT nasal spray Place 2 sprays into both nostrils daily. 16 g 0   vitamin C (ASCORBIC ACID) 250 MG tablet Take 250 mg by mouth daily.     EPINEPHrine 0.3 mg/0.3 mL IJ SOAJ injection Inject 0.3 mg into the muscle as needed for anaphylaxis. (Patient not taking: Reported on 06/14/2023) 1 each 0   No current facility-administered medications on file prior to visit.   Allergies   Allergen Reactions   Clindamycin/Lincomycin Diarrhea   Penicillins        Review of Systems  All other systems reviewed and are negative.      Objective:   Physical Exam Vitals reviewed.  Constitutional:      General: She is not in acute distress.    Appearance: Normal appearance. She is not ill-appearing, toxic-appearing or diaphoretic.  HENT:     Mouth/Throat:     Dentition: Abnormal dentition. Dental tenderness and gingival swelling present. No dental abscesses or gum lesions.   Cardiovascular:     Rate and Rhythm: Normal rate and regular rhythm.     Heart sounds: Normal heart sounds.  Pulmonary:     Effort: Pulmonary effort is normal.     Breath sounds: Normal breath sounds.  Neurological:     Mental Status: She is alert.           Assessment & Plan:  Periodontal disease, unspecified Recommended the patient see a dentist immediately.  I believe that she is losing the tooth due to periodontal disease.  I see no contraindication to her taking an antibiotic.  I assured the patient that I do not feel the antibiotic caused the current issue with her tooth or previously.  I also do not believe that there is any residual infection from years ago that is causing this.  I believe that this is solely an isolated dental  problem and requires the care and attention from a dentist

## 2023-12-05 ENCOUNTER — Ambulatory Visit: Payer: Self-pay

## 2023-12-05 ENCOUNTER — Encounter: Payer: Self-pay | Admitting: Family Medicine

## 2023-12-05 ENCOUNTER — Ambulatory Visit: Admitting: Family Medicine

## 2023-12-05 ENCOUNTER — Ambulatory Visit: Payer: Self-pay | Admitting: Family Medicine

## 2023-12-05 ENCOUNTER — Other Ambulatory Visit: Payer: Self-pay | Admitting: Family Medicine

## 2023-12-05 ENCOUNTER — Telehealth: Payer: Self-pay | Admitting: Family Medicine

## 2023-12-05 VITALS — BP 130/79 | HR 104 | Temp 98.0°F | Ht 62.0 in | Wt 117.0 lb

## 2023-12-05 DIAGNOSIS — R319 Hematuria, unspecified: Secondary | ICD-10-CM | POA: Diagnosis not present

## 2023-12-05 LAB — CBC WITH DIFFERENTIAL/PLATELET
Absolute Lymphocytes: 1475 {cells}/uL (ref 850–3900)
Absolute Monocytes: 774 {cells}/uL (ref 200–950)
Basophils Absolute: 44 {cells}/uL (ref 0–200)
Basophils Relative: 0.3 %
Eosinophils Absolute: 15 {cells}/uL (ref 15–500)
Eosinophils Relative: 0.1 %
HCT: 44.7 % (ref 35.0–45.0)
Hemoglobin: 14.7 g/dL (ref 11.7–15.5)
MCH: 28.4 pg (ref 27.0–33.0)
MCHC: 32.9 g/dL (ref 32.0–36.0)
MCV: 86.5 fL (ref 80.0–100.0)
MPV: 10.6 fL (ref 7.5–12.5)
Monocytes Relative: 5.3 %
Neutro Abs: 12293 {cells}/uL — ABNORMAL HIGH (ref 1500–7800)
Neutrophils Relative %: 84.2 %
Platelets: 260 Thousand/uL (ref 140–400)
RBC: 5.17 Million/uL — ABNORMAL HIGH (ref 3.80–5.10)
RDW: 13.2 % (ref 11.0–15.0)
Total Lymphocyte: 10.1 %
WBC: 14.6 Thousand/uL — ABNORMAL HIGH (ref 3.8–10.8)

## 2023-12-05 LAB — COMPREHENSIVE METABOLIC PANEL WITH GFR
AG Ratio: 2 (calc) (ref 1.0–2.5)
ALT: 14 U/L (ref 6–29)
AST: 20 U/L (ref 10–35)
Albumin: 4.8 g/dL (ref 3.6–5.1)
Alkaline phosphatase (APISO): 67 U/L (ref 37–153)
BUN/Creatinine Ratio: 23 (calc) — ABNORMAL HIGH (ref 6–22)
BUN: 11 mg/dL (ref 7–25)
CO2: 28 mmol/L (ref 20–32)
Calcium: 10.7 mg/dL — ABNORMAL HIGH (ref 8.6–10.4)
Chloride: 101 mmol/L (ref 98–110)
Creat: 0.48 mg/dL — ABNORMAL LOW (ref 0.60–0.95)
Globulin: 2.4 g/dL (ref 1.9–3.7)
Glucose, Bld: 98 mg/dL (ref 65–99)
Potassium: 3.9 mmol/L (ref 3.5–5.3)
Sodium: 139 mmol/L (ref 135–146)
Total Bilirubin: 0.9 mg/dL (ref 0.2–1.2)
Total Protein: 7.2 g/dL (ref 6.1–8.1)
eGFR: 96 mL/min/1.73m2 (ref >=60)

## 2023-12-05 LAB — URINALYSIS, ROUTINE W REFLEX MICROSCOPIC

## 2023-12-05 NOTE — Assessment & Plan Note (Addendum)
 UA obscured by proliferative RBCs. Christina Chase has been taking ASA for several weeks, what sounds like 325mg  daily but she is not sure. She is having some lightheadedness but is unsure if this is related to anxiety and white coat as well as the realization the ASA may have caused this. No abdominal or flank pain, is voiding regularly, and VSS. Discussed with her and her husband that they should have a low threshold for going to the ED if symptoms worsen. CBC and CMP today with urine culture. STOP the Aspirin ! If symptoms persist will refer to urology.

## 2023-12-05 NOTE — Telephone Encounter (Signed)
 FYI Only or Action Required?: FYI only for provider.  Patient was last seen in primary care on 06/14/2023 by Christina Butler DASEN, MD.  Called Nurse Triage reporting Urinary Frequency.  Symptoms began yesterday.  Interventions attempted: Nothing.  Symptoms are: unchanged.  Triage Disposition: See Physician Within 24 Hours  Patient/caregiver understands and will follow disposition?: Yes    Copied from CRM [id number]. Topic: Clinical - Red Word Triage >> Dec 05, 2023 10:08 AM Debby BROCKS wrote: Red Word that prompted transfer to Nurse Triage: This morning patient had blood in urine Reason for Disposition  Urinating more frequently than usual (i.e., frequency) OR new-onset of the feeling of an urgent need to urinate (i.e., urgency)  Answer Assessment - Initial Assessment Questions 1. SYMPTOM: What's the main symptom you're concerned about? (e.g., frequency, incontinence)     Frequency, urgency 2. ONSET: When did the    start?     X couple of weeks 3. PAIN: Is there any pain? If Yes, ask: How bad is it? (Scale: 1-10; mild, moderate, severe)     no 4. CAUSE: What do you think is causing the symptoms?     unknown 5. OTHER SYMPTOMS: Do you have any other symptoms? (e.g., blood in urine, fever, flank pain, pain with urination)     Vaginal irritation, blood in urine, chills 6. PREGNANCY: Is there any chance you are pregnant? When was your last menstrual period?     no  Protocols used: Urinary Symptoms-A-AH

## 2023-12-05 NOTE — Telephone Encounter (Signed)
 Called pt to discuss lab results. She reports her hematuria is clearing up since she was here this am. Would like to defer abx until culture is back. Will return to office if hematuria persists to next week

## 2023-12-05 NOTE — Progress Notes (Signed)
 Subjective:  HPI: Christina Chase is a 78 y.o. female presenting on 12/05/2023 for Acute Visit (Blood in urine started this morning./Frequency has been for the past 2 weeks )   HPI Patient is in today for concerns of urinary frequency and urgency for 2 weeks and hematuria starting today. Denies dysuria, fever, chills, body aches, abdominal pain, flank pain. No history of kidney stones. She is experiencing lightheadedness and low energy. Denies SOB, chest pain. She has been taking ASA, unsure of dose but not bASA 81mg . Believes she is taking 125mg  daily for a few weeks.    Review of Systems  All other systems reviewed and are negative.   Relevant past medical history reviewed and updated as indicated.   History reviewed. No pertinent past medical history.   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   Current Outpatient Medications:    B Complex Vitamins (VITAMIN-B COMPLEX PO), Take 1 tablet by mouth daily., Disp: , Rfl:    cholecalciferol (VITAMIN D3) 25 MCG (1000 UT) tablet, Take 1,000 Units by mouth 4 (four) times daily., Disp: , Rfl:    dipyridamole-aspirin  (AGGRENOX) 200-25 MG 12hr capsule, Take 1 capsule by mouth daily., Disp: , Rfl:    vitamin C (ASCORBIC ACID) 250 MG tablet, Take 250 mg by mouth daily., Disp: , Rfl:   Allergies  Allergen Reactions   Clindamycin/Lincomycin Diarrhea   Penicillins     Objective:   BP 130/79   Pulse (!) 104   Temp 98 F (36.7 C)   Ht 5' 2 (1.575 m)   Wt 117 lb (53.1 kg)   SpO2 97%   BMI 21.40 kg/m      12/05/2023   10:53 AM 06/14/2023    2:59 PM 04/20/2023   10:17 AM  Vitals with BMI  Height 5' 2 5' 2 5' 2  Weight 117 lbs 117 lbs 3 oz 115 lbs 3 oz  BMI 21.39 21.43 21.06  Systolic  130 126 118  Diastolic 79 86 62  Pulse 104 106 94     Physical Exam Vitals and nursing note reviewed.  Constitutional:      Appearance: Normal appearance. She is normal weight.  HENT:     Head: Normocephalic and atraumatic.  Cardiovascular:     Rate and Rhythm: Regular rhythm. Tachycardia present.     Pulses: Normal pulses.     Heart sounds: Normal heart sounds.  Pulmonary:     Effort: Pulmonary effort is normal.     Breath sounds: Normal breath sounds.  Abdominal:     General: Abdomen is flat. Bowel sounds are normal. There is no distension.     Palpations: Abdomen is soft.     Tenderness: There is abdominal tenderness in the suprapubic area and left lower quadrant. There is no right CVA tenderness or left CVA tenderness.  Skin:    General: Skin is warm and dry.  Neurological:     General: No focal deficit present.     Mental Status: She is alert and oriented to person, place, and time. Mental status is at baseline.  Psychiatric:        Mood and Affect: Mood normal.        Behavior: Behavior normal.        Thought  Content: Thought content normal.        Judgment: Judgment normal.     Assessment & Plan:  Hematuria, unspecified type Assessment & Plan: UA obscured by proliferative RBCs. Christina Chase has been taking ASA for several weeks, what sounds like 325mg  daily but she is not sure. She is having some lightheadedness but is unsure if this is related to anxiety and white coat as well as the realization the ASA may have caused this. No abdominal or flank pain, is voiding regularly, and VSS. Discussed with her and her husband that they should have a low threshold for going to the ED if symptoms worsen. CBC and CMP today with urine culture. STOP the Aspirin ! If symptoms persist will refer to urology.  Orders: -     Urinalysis, Routine w reflex microscopic -     Urine Culture -     CBC with Differential/Platelet -     Comprehensive metabolic panel with GFR     Follow up  plan: Return in about 5 days (around 12/10/2023) for with PCP if sypmtoms persist.  Christina GORMAN Barrio, FNP

## 2023-12-06 LAB — URINE CULTURE
MICRO NUMBER:: 17041900
SPECIMEN QUALITY:: ADEQUATE
